# Patient Record
Sex: Male | Born: 1970 | Race: White | Hispanic: No | Marital: Married | Smoking: Former smoker
Health system: Southern US, Community
[De-identification: ages and names within clinical notes are randomized; demographics above are authoritative.]

## PROBLEM LIST (undated history)

## (undated) DIAGNOSIS — B019 Varicella without complication: Secondary | ICD-10-CM

## (undated) DIAGNOSIS — R519 Headache, unspecified: Secondary | ICD-10-CM

## (undated) DIAGNOSIS — L409 Psoriasis, unspecified: Secondary | ICD-10-CM

## (undated) DIAGNOSIS — R51 Headache: Secondary | ICD-10-CM

## (undated) HISTORY — DX: Varicella without complication: B01.9

## (undated) HISTORY — PX: FOOT SURGERY: SHX648

## (undated) HISTORY — PX: APPENDECTOMY: SHX54

## (undated) HISTORY — PX: BACK SURGERY: SHX140

## (undated) HISTORY — DX: Headache: R51

## (undated) HISTORY — DX: Headache, unspecified: R51.9

---

## 2010-12-21 ENCOUNTER — Emergency Department (HOSPITAL_COMMUNITY)
Admission: EM | Admit: 2010-12-21 | Discharge: 2010-12-21 | Payer: Self-pay | Source: Home / Self Care | Admitting: Emergency Medicine

## 2011-01-12 ENCOUNTER — Emergency Department (HOSPITAL_COMMUNITY)
Admission: EM | Admit: 2011-01-12 | Discharge: 2011-01-12 | Payer: Self-pay | Source: Home / Self Care | Admitting: Emergency Medicine

## 2011-01-13 ENCOUNTER — Ambulatory Visit (HOSPITAL_COMMUNITY)
Admission: RE | Admit: 2011-01-13 | Discharge: 2011-01-13 | Payer: Self-pay | Source: Home / Self Care | Attending: Emergency Medicine | Admitting: Emergency Medicine

## 2011-05-15 ENCOUNTER — Emergency Department (HOSPITAL_COMMUNITY): Payer: Self-pay

## 2011-05-15 ENCOUNTER — Emergency Department (HOSPITAL_COMMUNITY)
Admission: EM | Admit: 2011-05-15 | Discharge: 2011-05-15 | Disposition: A | Discharge status: Home / Self Care | Payer: Self-pay | Attending: Emergency Medicine | Admitting: Emergency Medicine

## 2011-05-15 DIAGNOSIS — M25539 Pain in unspecified wrist: Secondary | ICD-10-CM | POA: Insufficient documentation

## 2011-05-15 DIAGNOSIS — Y998 Other external cause status: Secondary | ICD-10-CM | POA: Insufficient documentation

## 2011-05-15 DIAGNOSIS — S20229A Contusion of unspecified back wall of thorax, initial encounter: Secondary | ICD-10-CM | POA: Insufficient documentation

## 2011-05-15 DIAGNOSIS — W19XXXA Unspecified fall, initial encounter: Secondary | ICD-10-CM | POA: Insufficient documentation

## 2011-05-15 DIAGNOSIS — Y92009 Unspecified place in unspecified non-institutional (private) residence as the place of occurrence of the external cause: Secondary | ICD-10-CM | POA: Insufficient documentation

## 2011-05-15 DIAGNOSIS — Z79899 Other long term (current) drug therapy: Secondary | ICD-10-CM | POA: Insufficient documentation

## 2011-05-15 DIAGNOSIS — IMO0002 Reserved for concepts with insufficient information to code with codable children: Secondary | ICD-10-CM | POA: Insufficient documentation

## 2011-05-18 ENCOUNTER — Other Ambulatory Visit (HOSPITAL_COMMUNITY): Payer: Self-pay | Admitting: Orthopedic Surgery

## 2011-05-18 DIAGNOSIS — M545 Low back pain: Secondary | ICD-10-CM

## 2011-05-25 ENCOUNTER — Other Ambulatory Visit (HOSPITAL_COMMUNITY): Payer: Self-pay | Admitting: Orthopedic Surgery

## 2011-05-25 DIAGNOSIS — M545 Low back pain: Secondary | ICD-10-CM

## 2011-05-27 ENCOUNTER — Ambulatory Visit (HOSPITAL_COMMUNITY)
Admission: RE | Admit: 2011-05-27 | Discharge: 2011-05-27 | Disposition: A | Discharge status: Home / Self Care | Payer: BC Managed Care – PPO | Source: Ambulatory Visit | Attending: Orthopedic Surgery | Admitting: Orthopedic Surgery

## 2011-05-27 ENCOUNTER — Emergency Department (HOSPITAL_COMMUNITY)
Admission: EM | Admit: 2011-05-27 | Discharge: 2011-05-27 | Disposition: A | Discharge status: Home / Self Care | Payer: BC Managed Care – PPO | Attending: Emergency Medicine | Admitting: Emergency Medicine

## 2011-05-27 DIAGNOSIS — D1809 Hemangioma of other sites: Secondary | ICD-10-CM | POA: Insufficient documentation

## 2011-05-27 DIAGNOSIS — M545 Low back pain, unspecified: Secondary | ICD-10-CM | POA: Insufficient documentation

## 2011-05-27 DIAGNOSIS — M48061 Spinal stenosis, lumbar region without neurogenic claudication: Secondary | ICD-10-CM | POA: Insufficient documentation

## 2011-05-27 DIAGNOSIS — M5126 Other intervertebral disc displacement, lumbar region: Secondary | ICD-10-CM | POA: Insufficient documentation

## 2011-05-27 DIAGNOSIS — M79609 Pain in unspecified limb: Secondary | ICD-10-CM | POA: Insufficient documentation

## 2011-06-02 ENCOUNTER — Other Ambulatory Visit (HOSPITAL_COMMUNITY): Payer: Self-pay | Admitting: Orthopedic Surgery

## 2011-06-02 ENCOUNTER — Encounter (HOSPITAL_COMMUNITY)
Admission: RE | Admit: 2011-06-02 | Discharge: 2011-06-02 | Disposition: A | Discharge status: Home / Self Care | Payer: BC Managed Care – PPO | Source: Ambulatory Visit | Attending: Orthopedic Surgery | Admitting: Orthopedic Surgery

## 2011-06-02 ENCOUNTER — Ambulatory Visit (HOSPITAL_COMMUNITY)
Admission: RE | Admit: 2011-06-02 | Discharge: 2011-06-02 | Disposition: A | Discharge status: Home / Self Care | Payer: BC Managed Care – PPO | Source: Ambulatory Visit | Attending: Orthopedic Surgery | Admitting: Orthopedic Surgery

## 2011-06-02 DIAGNOSIS — M5126 Other intervertebral disc displacement, lumbar region: Secondary | ICD-10-CM

## 2011-06-02 LAB — COMPREHENSIVE METABOLIC PANEL
BUN: 12 mg/dL (ref 6–23)
CO2: 25 mEq/L (ref 19–32)
Chloride: 103 mEq/L (ref 96–112)
Creatinine, Ser: 1.11 mg/dL (ref 0.4–1.5)
GFR calc Af Amer: >60 mL/min (ref >60)
GFR calc non Af Amer: >60 mL/min (ref >60)
Total Bilirubin: 0.3 mg/dL (ref 0.3–1.2)

## 2011-06-02 LAB — TYPE AND SCREEN: Antibody Screen: NEGATIVE

## 2011-06-02 LAB — CBC
Hemoglobin: 17.4 g/dL — ABNORMAL HIGH (ref 13.0–17.0)
MCHC: 36.3 g/dL — ABNORMAL HIGH (ref 30.0–36.0)

## 2011-06-02 LAB — SURGICAL PCR SCREEN
MRSA, PCR: NEGATIVE
Staphylococcus aureus: POSITIVE — AB

## 2011-06-02 LAB — URINALYSIS, ROUTINE W REFLEX MICROSCOPIC
Bilirubin Urine: NEGATIVE
Leukocytes, UA: NEGATIVE
Nitrite: NEGATIVE
Specific Gravity, Urine: 1.019 (ref 1.005–1.030)
Urobilinogen, UA: 1 mg/dL (ref 0.0–1.0)

## 2011-06-02 LAB — DIFFERENTIAL
Basophils Absolute: 0 10*3/uL (ref 0.0–0.1)
Basophils Relative: 0 % (ref 0–1)
Monocytes Absolute: 0.7 10*3/uL (ref 0.1–1.0)
Neutro Abs: 13.4 10*3/uL — ABNORMAL HIGH (ref 1.7–7.7)
Neutrophils Relative %: 77 % (ref 43–77)

## 2011-06-02 LAB — PROTIME-INR
INR: 0.93 (ref 0.00–1.49)
Prothrombin Time: 12.7 seconds (ref 11.6–15.2)

## 2011-06-03 ENCOUNTER — Inpatient Hospital Stay (HOSPITAL_COMMUNITY)
Admission: RE | Admit: 2011-06-03 | Discharge: 2011-06-05 | DRG: 755 | Disposition: A | Discharge status: Home / Self Care | Payer: BC Managed Care – PPO | Source: Ambulatory Visit | Attending: Orthopedic Surgery | Admitting: Orthopedic Surgery

## 2011-06-03 ENCOUNTER — Inpatient Hospital Stay (HOSPITAL_COMMUNITY): Payer: BC Managed Care – PPO

## 2011-06-03 DIAGNOSIS — Z01812 Encounter for preprocedural laboratory examination: Secondary | ICD-10-CM

## 2011-06-03 DIAGNOSIS — G4700 Insomnia, unspecified: Secondary | ICD-10-CM | POA: Diagnosis present

## 2011-06-03 DIAGNOSIS — F17200 Nicotine dependence, unspecified, uncomplicated: Secondary | ICD-10-CM | POA: Diagnosis present

## 2011-06-03 DIAGNOSIS — M5126 Other intervertebral disc displacement, lumbar region: Principal | ICD-10-CM | POA: Diagnosis present

## 2011-06-03 DIAGNOSIS — Y838 Other surgical procedures as the cause of abnormal reaction of the patient, or of later complication, without mention of misadventure at the time of the procedure: Secondary | ICD-10-CM | POA: Diagnosis not present

## 2011-06-03 DIAGNOSIS — K219 Gastro-esophageal reflux disease without esophagitis: Secondary | ICD-10-CM | POA: Diagnosis present

## 2011-06-03 DIAGNOSIS — K929 Disease of digestive system, unspecified: Secondary | ICD-10-CM | POA: Diagnosis not present

## 2011-06-03 DIAGNOSIS — K560 Paralytic ileus: Secondary | ICD-10-CM | POA: Diagnosis not present

## 2011-06-03 DIAGNOSIS — Y9210 Unspecified residential institution as the place of occurrence of the external cause: Secondary | ICD-10-CM | POA: Diagnosis not present

## 2011-06-04 ENCOUNTER — Inpatient Hospital Stay (HOSPITAL_COMMUNITY): Payer: BC Managed Care – PPO

## 2011-06-04 LAB — CBC
HCT: 39.1 % (ref 39.0–52.0)
Hemoglobin: 14.2 g/dL (ref 13.0–17.0)
MCV: 86.1 fL (ref 78.0–100.0)
RBC: 4.54 MIL/uL (ref 4.22–5.81)
WBC: 19.5 10*3/uL — ABNORMAL HIGH (ref 4.0–10.5)

## 2011-06-04 NOTE — Op Note (Signed)
NAME:  Zachary [name], GOLDMAN NO.:  [id number]  MEDICAL RECORD NO.:  [id number]  LOCATION:  5017                         FACILITY:  [facility]  PHYSICIAN:  Estill Bamberg, MD      DATE OF BIRTH:  1971-08-24  DATE OF PROCEDURE:  06/03/2011 DATE OF DISCHARGE:                              OPERATIVE REPORT   PREOPERATIVE DIAGNOSES: 1. Recurrent left-sided L5-S1 disk herniation. 2. Severe left-sided S1 radiculopathy.  POSTOPERATIVE DIAGNOSES: 1. Recurrent left-sided L5-S1 disk herniation. 2. Severe left-sided S1 radiculopathy.  PROCEDURES: 1. Transforaminal lumbar interbody fusion from the left side, L5-S1. 2. Posterolateral fusion, L5-S1 3. Insertion of posterior mentation, L5-S1. 4. Insertion of intervertebral device x1. 5. Use of local autograft. 6. Intraoperative bone marrow aspiration from a second incision. 7. Intraoperative use of fluoroscopy. 8. Revision laminectomy, L5-S1.  SURGEON:  Estill Bamberg, MD  ASSISTANT:  Janace Litten, OPA  ANESTHESIA:  General endotracheal anesthesia.  COMPLICATIONS:  None.  DISPOSITION:  Stable.  ESTIMATED BLOOD LOSS:  600 mL with 125 mL of Cell Saver retransfused.  INDICATIONS FOR PROCEDURE:  Briefly, Zachary [name] is a pleasant 40 year old male who is status post an L5-S1 microdiskectomy by me.  The patient initially did extremely well for approximately 2-1/2 months.  However, he had a severe recurrence of his left leg pain.  I did order an MRI and did review it, the MRI was clearly consistent with a rather large recurrent left-sided L5-S1 disk herniation.  The patient and myself did have a discussion regarding going forward with repeat diskectomy with a repeat revision diskectomy versus going forward with a diskectomy and a left-sided transforaminal lumbar interbody fusion.  The patient fully understood the risks and limitations of the procedure as outlined in my preoperative note.  OPERATIVE DETAILS:  On June 03, 2011,  the patient was brought to Surgery and general endotracheal anesthesia was administered.  The patient was placed prone on a well-padded Jackson spinal frame.  All bony prominences were meticulously padded.  I did obtain a lateral intraoperative radiograph to identify the trajectory of the L5 and S1 vertebral bodies and the intervertebral space.  The back was then prepped and draped in the usual sterile fashion.  An Ancef was given. The time-out procedure was performed.  I then made an incision from approximately the spinous process of L4 to approximately spinous process of S1.  The fascia was readily identified and sharply incised.  The fascia in the paraspinal musculature was retracted laterally.  I readily identify the L5 and S1 lamina.  I did bring in lateral and intraoperative fluoroscopy and I did confirm the appropriate level.  I then subperiosteally exposed the lamina of L5 and S1 in addition to the transverse processes of L5 and the sacral ala of S1.  I then cannulated the L5 and S1 pedicles first on the right and then on the left.  A ball- tip probe was used and a 7-mm tap was used at S1 and a 6-mm tap was used at L5.  The holes were sealed with bone wax and the posterolateral gutters were packed using Ray-Tec soaked thrombin.  I then turned my attention towards the intervertebral space  on the left side.  The L5-S1 intervertebral space was readily identified.  There was abundant scar identified in the intervertebral space, consistent with the patient's previous microdiskectomy.  I did use a #1 curette to tease away the epidural scar from the bone around it.  I was able to develop the safe plane and I did remove the inferior aspect of L5 including the inferior articular process.  The superior articular process of S1 was also removed.  Of note, this particular portion of the procedure took a meticulous amount of care, given the extensive scar identified.  I then went and turned my  attention towards the decompression aspect of the procedure.  The traversing S1 nerve was readily identified.  There was excessive pressure noted on the nerve and I was unable to easily retract the nerve medially.  I did use a series of curettes in addition to nerve hooks and I was able to develop a plane between the underlying disk fragment and the traversing S1 nerve.  I then was able to safely retract the nerve medially and a very large L5-S1 disk herniation did readily declare itself and this was removed in its entirety.  Again, he had herniation was noted to be very large and causing excessive pressure on the traversing S1 nerve.  However, it was removed in its entirety.  I then identified the exiting L5 nerve and this was gently swept superiorly.  I then turned my attention towards the L5-S1 intervertebral space.  I went forward with a thorough diskectomy using a series of the paddle scrapers in addition to curettes and pituitaries.  After a thorough diskectomy was performed, I went forward with placing trial intervertebral spacers.  I did feel that a 10-mm intervertebral spacer would be the most appropriate fit.  I did use AP and lateral fluoroscopy in order to ensure appropriate positioning of the intervertebral trial and adequate assessment of the length.  At this point, I made a separate incision over the patient's right posterior iliac crest.  I then obtained 8 mL of bone marrow aspirate from the patient's right iliac crest and this was mixed with a 10 mL aliquot of Vitoss BA.  I then compared the intervertebral space with autograft that was obtained from the decompression aspect of the procedure.  Vitoss BA was also utilized. I then filled a 10-mm parallel Concorde bullet cage with autograft mixed with Vitoss BA and the bone marrow aspirate mixture.  This was tamped into position under live fluoroscopy.  I did know an excellent press-fit in an excellent parents on AP and  lateral fluoroscopic views.  I then placed the left-sided L5 and S1 instrumentation.  The 7 x 45 mm screws were placed at L5 and 8 x 45-mm screws were placed at S1.  I did use triggered EMG to test each of the screws.  On the left side, there was no motor evoked potential that occurred at anything less than 30 mA.  I then placed a 35-mm rod and a compression maneuver was performed across the rod.  Final locking procedure was then done.  I then turned my attention towards the patient's right side.  At this point, I copiously irrigated the wound with approximately 1 liter of normal saline.  I then used a high-speed bur to decorticate the transverse process of L5, the sacral ala of S1 in addition to the posterior elements of L5 and S1. Autograft and Vitoss BA was packed into the posterolateral gutter and screws were  placed again using the size as previously mentioned.  I then tested the screws.  The right side screw tested 23 and the S1 screw tested 30.  A 30-mm rod was placed and the final locking procedure was performed.  I was very happy with the final construct.  I again obtained lateral and an AP fluoroscopy in order to confirm an appropriate appearance of the construct.  I was very happy with the final appearance.  All epidural bleeding was controlled using bipolar electrocautery.  A deep Blake drain was placed across the posterior elements on the left side.  The fascia was then closed using #1 Vicryl. The subcutaneous layer was closed using 0 Vicryl in addition to 2-0 Vicryl and the skin was closed using 3-0 Monocryl, as was the iliac crest harvesting site.  A sterile dressing was applied and the patient was rolled supine and was awakened from general endotracheal anesthesia uneventfully.  Of note, Janace Litten was my assistant throughout the procedure and aided in essential retraction and suctioning required throughout the surgery.     Estill Bamberg, MD     MD/MEDQ  D:   06/03/2011  T:  06/04/2011  Job:  [id number]  Electronically Signed by Loraine Leriche Ethridge Sollenberger  on 06/04/2011 05:38:08 PM

## 2011-06-16 ENCOUNTER — Inpatient Hospital Stay (HOSPITAL_COMMUNITY)
Admission: EM | Admit: 2011-06-16 | Discharge: 2011-06-19 | DRG: 297 | Disposition: A | Discharge status: Home / Self Care | Payer: BC Managed Care – PPO | Source: Ambulatory Visit | Attending: Internal Medicine | Admitting: Internal Medicine

## 2011-06-16 ENCOUNTER — Emergency Department (HOSPITAL_COMMUNITY): Payer: BC Managed Care – PPO

## 2011-06-16 DIAGNOSIS — R197 Diarrhea, unspecified: Secondary | ICD-10-CM | POA: Diagnosis present

## 2011-06-16 DIAGNOSIS — D72829 Elevated white blood cell count, unspecified: Secondary | ICD-10-CM | POA: Diagnosis present

## 2011-06-16 DIAGNOSIS — D649 Anemia, unspecified: Secondary | ICD-10-CM | POA: Diagnosis present

## 2011-06-16 DIAGNOSIS — E860 Dehydration: Principal | ICD-10-CM | POA: Diagnosis present

## 2011-06-16 DIAGNOSIS — E876 Hypokalemia: Secondary | ICD-10-CM | POA: Diagnosis present

## 2011-06-16 DIAGNOSIS — M549 Dorsalgia, unspecified: Secondary | ICD-10-CM | POA: Diagnosis present

## 2011-06-16 DIAGNOSIS — R112 Nausea with vomiting, unspecified: Secondary | ICD-10-CM | POA: Diagnosis present

## 2011-06-16 DIAGNOSIS — F17200 Nicotine dependence, unspecified, uncomplicated: Secondary | ICD-10-CM | POA: Diagnosis present

## 2011-06-16 LAB — MAGNESIUM: Magnesium: 1.9 mg/dL (ref 1.5–2.5)

## 2011-06-16 LAB — CBC
HCT: 41.7 % (ref 39.0–52.0)
MCHC: 36.7 g/dL — ABNORMAL HIGH (ref 30.0–36.0)
MCV: 83.2 fL (ref 78.0–100.0)
RDW: 11.7 % (ref 11.5–15.5)

## 2011-06-16 LAB — CLOSTRIDIUM DIFFICILE BY PCR: Toxigenic C. Difficile by PCR: NEGATIVE

## 2011-06-16 LAB — DIFFERENTIAL
Eosinophils Relative: 1 % (ref 0–5)
Lymphocytes Relative: 16 % (ref 12–46)
Lymphs Abs: 3.2 10*3/uL (ref 0.7–4.0)
Monocytes Absolute: 1.3 10*3/uL — ABNORMAL HIGH (ref 0.1–1.0)
Monocytes Relative: 7 % (ref 3–12)

## 2011-06-16 LAB — COMPREHENSIVE METABOLIC PANEL
Albumin: 3.9 g/dL (ref 3.5–5.2)
BUN: 14 mg/dL (ref 6–23)
Creatinine, Ser: 1.09 mg/dL (ref 0.50–1.35)
GFR calc Af Amer: >60 mL/min (ref >60)
Glucose, Bld: 106 mg/dL — ABNORMAL HIGH (ref 70–99)
Total Bilirubin: 0.5 mg/dL (ref 0.3–1.2)
Total Protein: 7.5 g/dL (ref 6.0–8.3)

## 2011-06-17 LAB — CBC
Hemoglobin: 12.7 g/dL — ABNORMAL LOW (ref 13.0–17.0)
MCH: 29.5 pg (ref 26.0–34.0)
MCHC: 35.2 g/dL (ref 30.0–36.0)
Platelets: 328 10*3/uL (ref 150–400)
RDW: 11.7 % (ref 11.5–15.5)

## 2011-06-17 LAB — BASIC METABOLIC PANEL
BUN: 11 mg/dL (ref 6–23)
Calcium: 8.9 mg/dL (ref 8.4–10.5)
GFR calc Af Amer: >60 mL/min (ref >60)
GFR calc non Af Amer: >60 mL/min (ref >60)
Potassium: 3.8 mEq/L (ref 3.5–5.1)

## 2011-06-17 NOTE — Consult Note (Signed)
NAME:  Zachary [name], Zachary [name] NO.:  [id number]  MEDICAL RECORD NO.:  [id number]  LOCATION:  5017                         FACILITY:  [facility]  PHYSICIAN:  Ardeth Sportsman, MD     DATE OF BIRTH:  1971-12-07  DATE OF CONSULTATION: DATE OF DISCHARGE:                                CONSULTATION   REQUESTING PHYSICIAN:  Estill Bamberg, MD  SURGEON:  Ardeth Sportsman, MD  REASON FOR EVALUATION:  Abdominal pain distention, question ileus versus perforation status post postop day 1 from spinal surgery.  HISTORY OF PRESENT ILLNESS:  Zachary [name] is a 40 year old overweight male who is on chronic antiinflammatories and muscle relaxants for chronic low back pain and lumbar disk disease.  He underwent interbody fusion, L5-S1, and no other interventions at this level for recurrent left-sided disk herniation with significant radiculopathy especially this one level.  Postoperatively, he has been on a PCA and has gone through about 4 syringes and also OR medicines.  He tolerated about half of his breakfast, half of his lunch, but just before lunch, began to have some abdominal discomfort and has became more intense.  He became more distended. Based on concerns, Dr. Yevette Edwards requested surgical evaluation.  The patient has a history of heartburn and reflux, he normally has a bowel movement every morning.  He has not had a bowel movements since surgery.  He denies any fever, chills, sweats.  He has some moderate back pain when he coughs, but not really in the abdomen.  I examined him some burping and eructation.  No hematochezia, melena.  No history of inflammatory bowel disease or irritable bowel syndrome.  No family history of any other GI disorders.  He does not take nonsteroidals at home.  PAST MEDICAL HISTORY: 1. Tobacco abuse. 2. Lumbar disk disease, status post surgery in April 2012 and also     yesterday. 3. Insomnia. 4. Chronic pain.  PAST SURGICAL HISTORY:  Appendectomy in  2002, lumbar back surgery in April 2012, and again L5-S1 back surgery yesterday as noted above.  SOCIAL HISTORY:  He has got about a 50-pack-year history of tobacco and currently smokes about 1-1/2 packs a day.  Occasionally, drinks alcohol and no other drug use.  He is married, in a stable relationship with 2 children, his family is here at bedside.  FAMILY HISTORY:  Negative for any GI disorders or any other abnormalities, they can recall.  REVIEW OF SYSTEMS:  As noted per HPI.  GENERAL:  No fevers, chills, or sweats.  No weight change.  Eyes:  No vision changes.  No scotomas. Lungs, Cardiac, Respiratory:  Otherwise, negative.  GI:  As noted above. No heartburn or reflux right now.  No hematochezia or melena.  He has been constipated.  He has not had any flatus since surgery.  HEENT:  No ear  changes or drainage.  Breasts:  No nipple pain or discharge. Genital:  No difficulty with urination or hematuria or polyuria. Hepatic, renal, and endocrine is negative.  Musculoskeletal:  Low back pain as noted above.  Otherwise, no new joint pain or aches. Heme/lymphatic:  Otherwise negative.  Psych:  No mood changes or other  changes.  PHYSICAL EXAMINATION:  VITAL SIGNS:  Temperature 98.1, pulse has been in the 60s, respirations 18, he is 96% on room air.  Blood pressure 110/74. GENERAL:  He is a well-developed, well-nourished white male, sitting up in a chair.  He was calmly talking on his phone with Dr. Yevette Edwards when I walked into room. PSYCH:  He is pleasant and interactive, good insight.  No evidence of any dementia, delirium, psychosis, or paranoia. EYES:  Pupils equal, round, and reactive to light.  Extraocular movements are intact. NECK:  Supple.  No masses.  Trachea is midline. LYMPH:  No headache, axillary or groin lymphadenopathy. HEART:  Regular rate and rhythm.  No murmurs, gallops, or rubs. BREAST:  No nipple masses or discharge. CHEST:  Clear to auscultation bilaterally.  No  wheezes, rales, or rhonchi. ABDOMEN:  Obese, but soft.  He has periumbilical incision, but no hernia. GENITAL:  Normal external male genitalia, uncircumcised.  No inguinal hernias.  Testes and scrotum otherwise normal. BACK:  He has a pain in his lower back area, I did not change dressing at this time.  Of note, his abdomen is moderately distended, but mostly soft.  Cough and percussion  did not exacerbate his pain, arguing against peritonitis. NEUROLOGICAL:  I did not check a gait, but cranial nerves II through XII are intact.  Hand grip is 5/5 equal and symmetrical.  There is no focal deficits.  No resting or intention tremors. SKIN:  No petechiae.  No purpura.  No other sores or lesions. MUSCULOSKELETAL:  Pretty range of motion in shoulders, elbows, wrists, as well as hips, knees and ankles.  He is limited in his mobility and range of motion of the lumbar spine was reasonable.  STUDIES:  Not available.  ASSESSMENT AND PLAN:  A 40 year old male postop day 1 from lumbar surgery with increased abdominal distention and pain, discomfort most likely consistent with ileus. 1. Agree with abdominal x-ray.  I will upgrade to a 3-way just to make     sure if there is no evidence of free air or abnormalities. 2. CBC sent. 3. Make n.p.o. with ice chips. 4. Try to minimize narcotics.  Although, certainly 0 narcotics it is     not possible on postop day 1 it seems like he sucked through a fair     amount already. 5. Increased mobility as tolerated.  It is cleared by PT.  He has a     cane.  I discussed with Dr. Yevette Edwards and he feels that mobilization     is reasonable and we will try to get him up more and more active.     Hopefully his ileus will resolve. 6. Suppository. 7. We will follow with you.  If the x-ray shows evidence of free air     or preparation,  or he has worsening clinical status, then we will     consider laparotomy.  However, I find that highly unlikely at an L5-     S1  level where the risk of injuries is very low and the case went     pretty routine and rectal perforation seems highly unlikely unless     there was something going on technically in the case which Dr.     Yevette Edwards did not feel happened.  Nonetheless, we will follow.  He     may need NG tube if he worsens.  Plan was discussed with the     patient and his family and  they agree.     Ardeth Sportsman, MD     SCG/MEDQ  D:  06/04/2011  T:  06/05/2011  Job:  [id number]  cc:   Estill Bamberg, MD  Electronically Signed by Karie Soda MD on 06/17/2011 07:15:49 AM

## 2011-06-18 ENCOUNTER — Other Ambulatory Visit (HOSPITAL_COMMUNITY): Payer: Self-pay

## 2011-06-18 LAB — RETICULOCYTES
RBC.: 4.37 MIL/uL (ref 4.22–5.81)
Retic Ct Pct: 1.3 % (ref 0.4–3.1)

## 2011-06-18 LAB — CBC
MCV: 84.9 fL (ref 78.0–100.0)
Platelets: 272 10*3/uL (ref 150–400)
RBC: 3.91 MIL/uL — ABNORMAL LOW (ref 4.22–5.81)
RDW: 11.8 % (ref 11.5–15.5)
WBC: 8.3 10*3/uL (ref 4.0–10.5)

## 2011-06-18 LAB — COMPREHENSIVE METABOLIC PANEL
ALT: 39 U/L (ref 0–53)
AST: 27 U/L (ref 0–37)
Albumin: 2.9 g/dL — ABNORMAL LOW (ref 3.5–5.2)
Alkaline Phosphatase: 54 U/L (ref 39–117)
CO2: 26 mEq/L (ref 19–32)
Chloride: 107 mEq/L (ref 96–112)
GFR calc non Af Amer: >60 mL/min (ref >60)
Potassium: 3.7 mEq/L (ref 3.5–5.1)
Sodium: 139 mEq/L (ref 135–145)
Total Bilirubin: 0.3 mg/dL (ref 0.3–1.2)

## 2011-06-18 LAB — FERRITIN: Ferritin: 170 ng/mL (ref 22–322)

## 2011-06-18 LAB — IRON AND TIBC
Saturation Ratios: 29 % (ref 20–55)
TIBC: 273 ug/dL (ref 215–435)
UIBC: 194 ug/dL

## 2011-06-18 LAB — FOLATE: Folate: 10.8 ng/mL

## 2011-06-18 LAB — TISSUE TRANSGLUTAMINASE, IGG: Tissue Transglut Ab: 14.7 U/mL (ref <20)

## 2011-06-19 LAB — BASIC METABOLIC PANEL
CO2: 27 mEq/L (ref 19–32)
Calcium: 8.7 mg/dL (ref 8.4–10.5)
Creatinine, Ser: 0.9 mg/dL (ref 0.50–1.35)
GFR calc non Af Amer: >60 mL/min (ref >60)
Glucose, Bld: 122 mg/dL — ABNORMAL HIGH (ref 70–99)
Sodium: 137 mEq/L (ref 135–145)

## 2011-06-19 LAB — CBC
Hemoglobin: 12.3 g/dL — ABNORMAL LOW (ref 13.0–17.0)
MCH: 30.2 pg (ref 26.0–34.0)
MCHC: 36 g/dL (ref 30.0–36.0)
MCV: 84 fL (ref 78.0–100.0)
RBC: 4.07 MIL/uL — ABNORMAL LOW (ref 4.22–5.81)

## 2011-06-20 LAB — STOOL CULTURE

## 2011-06-20 LAB — EHEC TOXIN BY EIA, STOOL

## 2011-06-21 ENCOUNTER — Telehealth: Payer: Self-pay

## 2011-06-21 NOTE — Telephone Encounter (Signed)
I attempted to reach the patient to schedule an office visit per Dr Leone Payor.  He was asked to follow him for abnormal celiac labs drawn while he was inpatient.  The phone is a non working number.  I have mailed him a letter asking him to call and schedule an office visit with Dr Leone Payor

## 2011-06-27 ENCOUNTER — Encounter: Payer: Self-pay | Admitting: *Deleted

## 2011-06-27 DIAGNOSIS — Z87891 Personal history of nicotine dependence: Secondary | ICD-10-CM | POA: Insufficient documentation

## 2011-06-27 DIAGNOSIS — R609 Edema, unspecified: Secondary | ICD-10-CM | POA: Insufficient documentation

## 2011-06-27 DIAGNOSIS — M79609 Pain in unspecified limb: Secondary | ICD-10-CM | POA: Insufficient documentation

## 2011-06-27 NOTE — ED Notes (Signed)
PATIENT WITH BILAT. FEET SWELLING, BACK SURGERY ON 06/03/11- DISC FUSION

## 2011-06-28 ENCOUNTER — Emergency Department (HOSPITAL_COMMUNITY)
Admission: EM | Admit: 2011-06-28 | Discharge: 2011-06-28 | Disposition: A | Discharge status: Home / Self Care | Payer: BC Managed Care – PPO | Attending: Emergency Medicine | Admitting: Emergency Medicine

## 2011-06-28 DIAGNOSIS — R609 Edema, unspecified: Secondary | ICD-10-CM

## 2011-06-28 NOTE — ED Notes (Signed)
Pt reports mild edema in feet.  History of back surgery 3 weeks ago.  Negative for calf tenderness.  Pulses palpable, cap refill <3 sec.  EDP at bedside.

## 2011-06-30 ENCOUNTER — Ambulatory Visit (HOSPITAL_COMMUNITY)
Admission: RE | Admit: 2011-06-30 | Discharge: 2011-06-30 | Disposition: A | Discharge status: Home / Self Care | Payer: BC Managed Care – PPO | Source: Ambulatory Visit | Attending: Family Medicine | Admitting: Family Medicine

## 2011-06-30 DIAGNOSIS — M7989 Other specified soft tissue disorders: Secondary | ICD-10-CM

## 2011-06-30 DIAGNOSIS — M79609 Pain in unspecified limb: Secondary | ICD-10-CM | POA: Insufficient documentation

## 2011-07-01 NOTE — ED Provider Notes (Addendum)
History     Chief Complaint  Patient presents with  . Leg Swelling   HPI Comments: C/o BLE swelling for a few days; no calf tenderness, f/c. S/p recent back sx L5-S1 2-3 weeks ago; ambulatory without neuro defecits  Patient is a 40 y.o. male presenting with leg pain. The history is provided by the patient.  Leg Pain  The incident occurred 2 days ago. There was no injury mechanism. The pain is present in the left foot and right foot. The patient is experiencing no pain. Pertinent negatives include no numbness, no loss of motion, no muscle weakness, no loss of sensation and no tingling. Associated symptoms comments: No pain, only BLE feet and ankle swelling.    History reviewed. No pertinent past medical history.  Past Surgical History  Procedure Date  . Back surgery   . Appendectomy     History reviewed. No pertinent family history.  History  Substance Use Topics  . Smoking status: Former Smoker    Quit date: 06/03/2011  . Smokeless tobacco: Not on file  . Alcohol Use: No      Review of Systems  Neurological: Negative for tingling and numbness.  All other systems reviewed and are negative.    Physical Exam  BP 118/71  Pulse 90  Temp(Src) 98.2 F (36.8 C) (Oral)  Resp 18  Ht 6' (1.829 m)  Wt 240 lb (108.863 kg)  BMI 32.55 kg/m2  SpO2 98%  Physical Exam  Nursing note and vitals reviewed. Constitutional: He is oriented to person, place, and time. No distress.       Appearance consistent with age of record  HENT:  Head: Normocephalic and atraumatic.  Right Ear: External ear normal.  Left Ear: External ear normal.  Nose: Nose normal.  Mouth/Throat: Oropharynx is clear and moist.  Eyes: Conjunctivae are normal.  Neck: Neck supple.  Cardiovascular: Normal rate and regular rhythm.  Exam reveals no gallop and no friction rub.   No murmur heard. Pulmonary/Chest: Effort normal and breath sounds normal. He has no wheezes. He has no rhonchi. He has no rales. He  exhibits no tenderness.  Abdominal: Soft. There is no tenderness.  Musculoskeletal: Normal range of motion. He exhibits edema.       1+ edema from ankle to feet; no calf or post thigh ttp; no HOMAN's sign; no clinical evidence of DVT  Neurological: He is alert and oriented to person, place, and time. No sensory deficit.  Skin: No rash noted.       Color normal  Psychiatric: He has a normal mood and affect.    ED Course  Procedures  Written by Enos Fling acting as scribe for Dr. Adriana Simas.  MDM No clinical evidence DVT; edema minimal; no testing necessary; pt and wife comfortable with discussion.    I personally performed the services described in this documentation, which was scribed in my presence. The recorded information has been reviewed and considered.  Donnetta Hutching, MD 07/01/11 1440

## 2011-07-03 NOTE — Discharge Summary (Signed)
  NAME:  Zachary, [name] NO.:  [id number]  MEDICAL RECORD NO.:  [id number]  LOCATION:  5017                         FACILITY:  [facility]  PHYSICIAN:  Estill Bamberg, MD      DATE OF BIRTH:  [birth year]  DATE OF ADMISSION:  06/03/2011 DATE OF DISCHARGE:  06/05/2011                              DISCHARGE SUMMARY   ADMITTING PHYSICIAN:  Estill Bamberg, MD  ADMISSION DIAGNOSES:  Recurrent left-sided L5-S1 disk herniation and severe left-sided S1 radiculopathy.  DISCHARGE DIAGNOSES:  Recurrent left-sided L5-S1 disk herniation and severe left-sided S1 radiculopathy.  PROCEDURE:  Left-sided L5-S1 transforaminal lumbar interbody fusion with revision decompression performed on June 03, 2011.  ADMISSION HISTORY:  Briefly, [name] is a pleasant 40 year old male who is status post a L5-S1 microdiskectomy by me.  The patient did extremely well for approximately two and half months.  However, did have recurrent disk herniation on the left side.  We did go forward with conservative care, but the patient continue to have severe and debilitating pain.  We therefore had a discussion regarding a revision microdiskectomy versus one level fusion procedure in the form of a transforaminal lumbar interbody fusion.  We did elect to go forward with the latter.  The patient was therefore admitted on June 03, 2011, for this procedure.  HOSPITAL COURSE:  The patient was admitted, date noted above and further procedure noted above.  The patient tolerated the procedure well and was transferred to recovery in stable condition.  Of note, the patient noted some abdominal discomfort.  In the afternoon of postop day #1, the patient had some distention and I did contact Dr. Karie Soda from General Surgery to evaluate the patient.  Radiographs were essentially consistent with an ileus.  This was simply observed and the patient's symptoms did resolve overtime.  The patient was mobilized  throughout his hospital stay and it was felt on the morning of postop day #2 that the patient's pain was well controlled and the patient was able to be safely discharged home.  The patient's abdominal discomfort did subside over time.  Of note, the patient's left-sided radiculopathy was entirely resolved immediately following his procedure.  DISCHARGE INSTRUCTIONS:  The patient will adhere to back precautions at all times.  He will keep the wound dry for total of 5 days.  He will follow up with me in approximately 2 weeks after his procedure.  He will take Percocet for pain and Valium for spasms.     Estill Bamberg, MD    MD/MEDQ  D:  06/28/2011  T:  06/28/2011  Job:  [id number]  Electronically Signed by Estill Bamberg  on 07/03/2011 04:15:42 PM

## 2011-07-08 NOTE — Discharge Summary (Signed)
NAME:  Zachary [name], ALLUMS NO.:  [id number]  MEDICAL RECORD NO.:  [id number]  LOCATION:  5528                         FACILITY:  [facility]  PHYSICIAN:  Altha Harm, MDDATE OF BIRTH:  [birth year]  DATE OF ADMISSION:  06/16/2011 DATE OF DISCHARGE:  06/19/2011                              DISCHARGE SUMMARY   DISCHARGE DISPOSITION:  Home.  FINAL DISCHARGE DIAGNOSES: 1. Diarrhea clostridium difficile negative. 2. Dehydration resolved. 3. Hypokalemia resolved. 4. Leukocytosis resolved. 5. Status post recent L5-S1 herniated disk status post disk fusion on     July 04, 2011.  DISCHARGE MEDICATIONS: 1. Flagyl 500 mg p.o. q. 8 hours x7 days. 2. Ambien 5 mg p.o. at bedtime p.r.n. 3. Valium 5 mg p.o. at bedtime. 4. Flexeril 5 mg p.o. b.i.d. p.r.n. 5. Hydrocodone/APAP 10/325 0.5 tablets p.o. q.6 h. p.r.n. pain.  CONSULTANTS:  None.  PROCEDURES:  None.  DIAGNOSTIC STUDIES:  Acute abdominal series which shows a nonspecific bowel gas pattern and no acute abnormalities.  PRIMARY CARE PHYSICIAN:  Unassigned.  CODE STATUS:  Full code.  ALLERGIES:  No known drug allergies.  CHIEF COMPLAINT:  Diarrhea, nausea, vomiting.  HISTORY OF PRESENT ILLNESS:  Please refer to the H and P by Dr. Ardyth Harps for details of the HPI.  However, in short, this is a 40 year old gentleman who had a recent hospitalization approximately 2 weeks ago for herniated disk.  The patient received IV antibiotics on that admission.  He developed a partial small-bowel obstruction which resolved spontaneously.  The patient was discharged home 4 days later. He says that the past 3-4 days he has been having copious watery diarrhea, greenish in color, foul odor about 40 episodes a day.  The patient was referred to [facility] for further evaluation and management.  HOSPITAL COURSE: 1. Diarrhea.  The patient had diarrhea and was presumed likely to be C     diff.  C diff PCR was sent which  was found to be negative.     However, given the clinical symptoms of the patient's history he     was started on Flagyl empirically and he is to complete a 10-day     course of Flagyl.  The patient also had confounding history     including celiac disease in his father and colon cancer in a     grandfather.  In light of this, it is recommended that the patient     should have follow-up evaluation with a gastroenterologist likely a     colonoscopy when he is in convalescent state.  Antibodies for     celiac disease including tTG, IgA and IgG were drawn here and is to     be followed up by Gastroenterology.  I have spoken with Dr. Stan Head from [facility] and he will follow up with the patient as     an outpatient.  The patient has been given the number to the office     and asked to follow up within a week.  The patient also offered that he had been on narcotics and has stopped them abruptly and wondered whether or not this could  have contributed to diarrhea.  It is possible but less likely given a short course which the patient was on narcotics. 1. Hypokalemia.  The patient was hypokalemic likely due to copious     diarrhea.  This was repleted orally and the patient is on normal     saline at the time of discharge. 2. Leukocytosis.  Leukocytosis felt to be associated with diarrhea and     with dehydration.  This is now resolved.  The patient was advancing his diet is now tolerating diet without any difficulty.  His stool at this time is pancake consistency and he has no further diarrhea.  PERTINENT LABORATORY STUDIES:  At the time of discharge, the patient had a white blood cell count of 7.3, hemoglobin of 12.3, hematocrit of 34.2, platelet count of 267.  Sodium 137, potassium 3.8, chloride 101, bicarb 27, BUN 10, creatinine 0.90.  CONDITION:  At the time of discharge is stable.  PHYSICAL EXAMINATION:  VITAL SIGNS:  Temperature is 97.8, blood pressure 122/79, respiratory  rate 20, heart rate 78, O2 sats 96% on room air. HEENT:  He is normocephalic, atraumatic.  Pupils are equally round and reactive to light and accommodation.  Extraocular movements are intact. Oropharynx is moist.  No active erythema or lesions are noted. NECK:  Trachea is midline.  No masses, no thyromegaly, no JVD, no carotid bruit. RESPIRATORY:  The patient has a normal respiratory effort, equal excursion bilaterally.  No wheezing or rhonchi noted. CARDIOVASCULAR:  He has got normal S1-S2.  No murmurs, rubs or gallops are noted.  PMI is nondisplaced, no heaves or thrills on palpation. ABDOMEN:  Obese, soft, nontender, nondistended.  No masses or hepatosplenomegaly. EXTREMITY:  No clubbing, cyanosis or edema. NEUROLOGICAL:  He has got no focal neurological deficits.  Cranial nerves II-XII are grossly intact. PSYCHIATRIC:  He is alert and oriented x3.  Good insight and cognition. Good recent and remote recall.  DIETARY RESTRICTIONS:  None except suggest a gluten-free diet since the patient does have a family history of celiac disease.  PHYSICAL RESTRICTIONS:  None.     Altha Harm, MD     MAM/MEDQ  D:  06/19/2011  T:  06/20/2011  Job:  [id number]  cc:   Iva Boop, MD,FACG  Electronically Signed by Marthann Schiller MD on 07/08/2011 12:21:33 PM

## 2011-07-31 NOTE — H&P (Signed)
NAME:  Zachary, [name] NO.:  [id number]  MEDICAL RECORD NO.:  [id number]  LOCATION:  [facility]                         FACILITY:  [facility]  PHYSICIAN:  Peggye Pitt, M.D. DATE OF BIRTH:  February 09, 1971  DATE OF ADMISSION:  06/16/2011 DATE OF DISCHARGE:                             HISTORY & PHYSICAL   PRIMARY CARE PHYSICIAN:  He is unassigned  CHIEF COMPLAINT:  Diarrhea, nausea and vomiting.  HISTORY OF PRESENT ILLNESS:  [name] is a very pleasant 40 year old Caucasian gentleman, who has a past medical history that is only significant for recent hospitalization in mid June for an L5-S1 herniated disk with severe left S1 radiculopathy.  On that admission, he was on IV antibiotics and it seems like he developed a partial small- bowel obstruction, which resolved spontaneously and was discharged home 4 days later.  He states that about for the past 3-4 days, he has been having copious watery diarrhea that is greenish in color with a very foul odor of about 40 episodes a day.  The abdominal pain is accompanied by cramping and tenesmus.  He states that today he also started having nausea and vomiting of all food contents.  Because of the copious diarrhea, he decided to seek help in the Emergency Department today and we were asked to admit him for further evaluation.  ALLERGIES:  He has no known drug allergies.  PAST MEDICAL HISTORY:  Nothing other than the L5-S1 herniated disk, status post fusion on June 04, 2011.  ,MEDICATIONS:  Significant for: 1. Valium 10 mg at bedtime. 2. As well as Vicodin 10 mg, which he takes 1 tablet every 6 hours as     needed for pain post surgery.  SOCIAL HISTORY:  He has not smoked since his surgery, so he is very proud that he has not smoked a cigarette in 13 days.  He denies any alcohol or illicit drug use.  He is married.  His wife is present at the time of my exam.  FAMILY HISTORY:  Significant for a maternal grandmother with  colon cancer.  Father with celiac disease and hypothyroidism and the mother with hypothyroidism.  REVIEW OF SYSTEMS:  Negative except as mentioned in history of present illness particularly negative for fever.  PHYSICAL EXAMINATION:  VITAL SIGNS:  On admission, blood pressure 134/120 that has dropped to 106/73; heart rate of 113, which has dropped to 96; temperature of 97.7; sats of 100% on room air. GENERAL:  He is alert, awake, oriented x3, is in no current distress. HEENT: Normocephalic, atraumatic.  Pupils are equal, round and reactive to light.  He has intact extraocular movements.  He wears corrective lenses.  He has a dry mucous membranes. NECK:  Supple.  No JVD.  No lymphadenopathy.  No bruits.  No goiter. HEART:  His heart is tachycardic, but with regular rhythm.  No murmurs, rubs or gallops. LUNGS:  Clear to auscultation bilaterally. ABDOMEN:  Currently soft, nontender to palpation, nondistended.  He has high-pitched bowel sounds. EXTREMITIES:  He has no edema.  Positive pedal pulses. NEUROLOGIC EXAMINATION:  Grossly intact and nonfocal.  LABORATORY DATA:  Labs on admission, sodium 136, potassium 3.2, chloride 100,  bicarb 22, BUN 14, creatinine 1.09, glucose of 106.  All of his LFTs are within normal limits.  WBCs 19.8 with an ANC of 15.1, a hemoglobin of 15.3 and a platelet count of 391,000.  DIAGNOSTIC STUDIES:  Acute abdominal series that shows no acute abnormalities with nonspecific bowel gas pattern.  ASSESSMENT AND PLAN: 1. Diarrhea, nausea, vomiting:  Given recent hospitalization and     intravenous antibiotic use, I do believe that he likely has     Clostridium difficile colitis.  We will admit him to the hospital.     We will place him on contact isolation.  We will check a     Clostridium difficile polymerase chain reaction as well as ova,     parasites and stool for bacterial cultures.  I will empirically     start him on Flagyl 500 mg intravenous three  times a day pending     results of Clostridium difficile polymerase chain reaction. 2. Hypokalemia:  This is likely secondary to gastrointestinal losses     and copious amounts of diarrhea.  I will replete his potassium and     give him some magnesium.  His leukocytosis is again likely     secondary to his infectious diarrhea.  I will follow his response     with antibiotics. 3. For deep venous thrombosis prophylaxis he will be on Lovenox.     Peggye Pitt, M.D.     EH/MEDQ  D:  06/16/2011  T:  06/16/2011  Job:  [id number]  Electronically Signed by Peggye Pitt M.D. on 07/31/2011 01:53:35 PM

## 2011-11-29 ENCOUNTER — Ambulatory Visit (HOSPITAL_COMMUNITY)
Admission: RE | Admit: 2011-11-29 | Discharge: 2011-11-29 | Disposition: A | Discharge status: Home / Self Care | Payer: Medicaid Other | Source: Ambulatory Visit | Attending: Orthopedic Surgery | Admitting: Orthopedic Surgery

## 2011-11-29 DIAGNOSIS — IMO0001 Reserved for inherently not codable concepts without codable children: Secondary | ICD-10-CM | POA: Insufficient documentation

## 2011-11-29 DIAGNOSIS — R262 Difficulty in walking, not elsewhere classified: Secondary | ICD-10-CM | POA: Insufficient documentation

## 2011-11-29 DIAGNOSIS — R29898 Other symptoms and signs involving the musculoskeletal system: Secondary | ICD-10-CM | POA: Insufficient documentation

## 2011-11-29 DIAGNOSIS — M545 Low back pain, unspecified: Secondary | ICD-10-CM | POA: Insufficient documentation

## 2011-11-29 DIAGNOSIS — M6281 Muscle weakness (generalized): Secondary | ICD-10-CM | POA: Insufficient documentation

## 2011-11-29 DIAGNOSIS — M2560 Stiffness of unspecified joint, not elsewhere classified: Secondary | ICD-10-CM | POA: Insufficient documentation

## 2011-11-29 NOTE — Patient Instructions (Addendum)
HEP

## 2011-11-29 NOTE — Progress Notes (Signed)
Physical Therapy Evaluation  Patient Details  Name: KALONJI ZURAWSKI MRN: [id number] Date of Birth: [birth year]  Today's Date: 11/29/2011 Time: 4034-7425 Time Calculation (min): 44 min Visit#: 1  of 8   Re-eval: 12/29/11 Assessment Diagnosis: back fusion Surgical Date: 05/24/11 Next MD Visit: 01/21/2012 Prior Therapy: none  Past Medical History: No past medical history on file. Past Surgical History:  Past Surgical History  Procedure Date  . Back surgery   . Appendectomy     Subjective Symptoms/Limitations Symptoms: Mr. Tornow is a 40 yo male with hx of LBP following two back surgeries. The patient states on Jan 24th he went to pick up his daughter and blew a disc out.  In April 5th he had surgery and did well, he went back to work after six weeks and went back to work.  The patient was only able to work for two weeks when he came back out of surgery and  had a fusion in June.  The patient states that he is doing much better than what he was prior to the surgeries but he is still having significant stiffness and some pain.  He is having radicular pain to his left knee.  He has not had any therapy since the surgery.  He is now being referred to therapy to improve his his funtional ability.   Limitations: Sitting;Lifting;Standing;Walking How long can you sit comfortably?: The patient states that his is able to sit for a short time before he starts to fidget.  He can sit for 10 min. How long can you stand comfortably?: The patient states that he gets a lot of tension in his back when he is standing.  He has stood for five minutes.  How long can you walk comfortably?: The patient states that he is able to walk for 20-30.   Repetition: Increases Symptoms Pain Assessment Currently in Pain?: Yes Pain Score:   4 (worst 8/10; lowest 2/10) Pain Location: Back Pain Orientation: Right;Left;Lower Pain Type: Chronic pain Pain Radiating Towards: L LE Leg pain is not constant. Pain Onset: More than  a month ago Pain Frequency: Constant Pain Relieving Factors: Lying downl Multiple Pain Sites: No   Prior Functioning  Home Living Lives With: Spouse Prior Function Vocation: Unemployed Leisure: Hobbies-yes (Comment) Comments: playing golf   Assessment RLE Strength Right Hip Flexion: 5/5 Right Hip Extension: 5/5 Right Hip ABduction: 5/5 Right Hip ADduction: 5/5 Right Knee Flexion: 5/5 Right Knee Extension: 5/5 Right Ankle Dorsiflexion: 5/5 LLE Strength Left Hip Flexion: 3/5 Left Hip Extension: 4/5 Left Hip ABduction: 5/5 Left Hip ADduction: 4/5 Left Knee Flexion: 4/5 Left Knee Extension: 3+/5 Left Ankle Dorsiflexion: 4/5 Lumbar AROM Lumbar Flexion:  (decreased 60%) Lumbar Extension: decreased 80% Lumbar - Right Side Bend:  (decreased 20%) Lumbar - Left Side Bend: decreased 20% Lumbar - Right Rotation: decreased 10% Lumbar - Left Rotation: decreased 10%  Exercise/Treatments Stretches Active Hamstring Stretch: 3 reps;30 seconds Single Knee to Chest Stretch: 3 reps;30 seconds Lower Trunk Rotation: 5 reps Lumbar Exercises  Stability Bent Knee Raise: 10 reps Ab Set: 5 reps Bridge x 10       Physical Therapy Assessment and Plan PT Assessment and Plan Clinical Impression Statement: Pt s/p back fusion with decreased strength, stiffness and pain affecting ADL.  Pt will benefit from skilled PT to address the deficits and progress pt to prior functional level. Rehab Potential: Good Clinical Impairments Affecting Rehab Potential: stiffness, weakness, pain PT Frequency: Min 2X/week PT Duration: 4 weeks PT Treatment/Interventions:  Therapeutic exercise;Functional mobility training;Patient/family education PT Plan: see two times a week for 4 weeks to improve functional abiliy.  Pt has medicaid so will be seen three times this yr and three times next year.    Goals Home Exercise Program Pt will Perform Home Exercise Program: Independently PT Short Term Goals Time to  Complete Short Term Goals: 2 weeks PT Short Term Goal 1: Pt pain to be no greater than a 6 PT Short Term Goal 2: ROM improved by 50% to allow pt to pick items off the floor with ease. PT Short Term Goal 3: Pt to be able to stand for 15-20 minutes without difficulty PT Short Term Goal 4: Pt to be able to sit for 30 min. with comfort. PT Long Term Goals Time to Complete Long Term Goals: 4 weeks PT Long Term Goal 1: I in advance HEP PT Long Term Goal 2: Pain level no greater than a 3 Long Term Goal 3: Able to sit for an hour without difficulty Long Term Goal 4: Pt to be able to stand for 40 min. without difficulty PT Long Term Goal 5: Pt strength wnl to allow the above to occur.  Problem List Patient Active Problem List  Diagnoses  . Left leg weakness  . Stiffness of joints, not elsewhere classified, multiple sites    PT - End of Session Activity Tolerance: Patient tolerated treatment well General Behavior During Session: [facility] for tasks performed Cognition: [facility] for tasks performed   Evamarie Raetz,CINDY 11/29/2011, 4:13 PM  Physician Documentation Your signature is required to indicate approval of the treatment plan as stated above.  Please sign and either send electronically or make a copy of this report for your files and return this physician signed original.   Please mark one 1.__approve of plan  2. ___approve of plan with the following conditions.   ______________________________                                                          _____________________ Physician Signature                                                                                                             Date

## 2011-12-02 ENCOUNTER — Ambulatory Visit (HOSPITAL_COMMUNITY)
Admission: RE | Admit: 2011-12-02 | Discharge: 2011-12-02 | Disposition: A | Discharge status: Home / Self Care | Payer: Medicaid Other | Source: Ambulatory Visit | Attending: Orthopedic Surgery | Admitting: Orthopedic Surgery

## 2011-12-02 NOTE — Progress Notes (Signed)
Physical Therapy Treatment Patient Details  Name: TRESON [name] MRN: [id number] Date of Birth: [birth year]  Today's Date: 12/02/2011 Time: 4098-1191 Time Calculation (min): 44 min Visit#: 2  of 8   Re-eval: 12/29/11 Assessment Diagnosis: back fusion Surgical Date: 05/24/11 Next MD Visit: 01/21/2012 Charges:  therex 42'  Subjective: Symptoms/Limitations Symptoms: Still getting an achey pain with occasional shooting pain down L LE to lateral knee.   Pain Assessment Currently in Pain?: Yes Pain Score:   4 Pain Location: Back Pain Orientation: Lower Pain Radiating Towards: L lateral knee   Exercise/Treatments Stretches Active Hamstring Stretch: 3 reps;30 seconds Single Knee to Chest Stretch: 3 reps;30 seconds Lower Trunk Rotation: 3 reps;10 seconds Stability Clam: 10 reps Bridge: 10 reps Bent Knee Raise: 10 reps Ab Set: 10 reps;5 seconds Straight Leg Raise: 10 reps  Physical Therapy Assessment and Plan PT Assessment and Plan Clinical Impression Statement: Added clams and SLR to POC.  Pt. displays good stability / control with all exercises and no pain reported with any activity. PT Plan: See 2 more times this calendar year and 3 next.  Continue to increase stability.     Problem List Patient Active Problem List  Diagnoses  . Left leg weakness  . Stiffness of joints, not elsewhere classified, multiple sites    PT - End of Session Activity Tolerance: Patient tolerated treatment well General Behavior During Session: Zachary [name] for tasks performed Cognition: Zachary [name] for tasks performed  Zachary [name] 12/02/2011, 11:54 AM

## 2011-12-07 ENCOUNTER — Ambulatory Visit (HOSPITAL_COMMUNITY)
Admission: RE | Admit: 2011-12-07 | Discharge: 2011-12-07 | Disposition: A | Discharge status: Home / Self Care | Payer: Medicaid Other | Source: Ambulatory Visit | Attending: Orthopedic Surgery | Admitting: Orthopedic Surgery

## 2011-12-07 NOTE — Progress Notes (Signed)
Physical Therapy Treatment Patient Details  Name: ASTER ECKRICH MRN: [id number] Date of Birth: 1971/07/17  Today's Date: 12/07/2011 Time: 4098-1191 Time Calculation (min): 48 min Visit#: 3  of 8   Re-eval: 12/29/11 Charges: Therex x 44'  Subjective: Symptoms/Limitations Symptoms: Pt reports back pain at 3/10 after pain medicine. Pain Assessment Currently in Pain?: Yes Pain Score:   3 Pain Location: Back Pain Orientation: Lower   Exercise/Treatments Stretches Active Hamstring Stretch: 3 reps;30 seconds Single Knee to Chest Stretch: 3 reps;30 seconds Lower Trunk Rotation: 3 reps;10 seconds Stability Clam: 10 reps Bridge: 15 reps Bent Knee Raise: 15 reps Ab Set: 10 reps;5 seconds Isometric Hip Flexion: 10 reps;5 seconds Straight Leg Raise: 10 reps  Physical Therapy Assessment and Plan PT Assessment and Plan Clinical Impression Statement: Pt requires vc's to improve hip stabilization with supine clams. Began isometric hip flexion with minimal difficulty to improve hip flexor strength. Pt w/o c/o increased pain throughout session.  PT Plan: Continue to progress per PT POC. Progress clams to SL and begin SL abd/add to improve hip stability.     Problem List Patient Active Problem List  Diagnoses  . Left leg weakness  . Stiffness of joints, not elsewhere classified, multiple sites    PT - End of Session Activity Tolerance: Patient tolerated treatment well General Behavior During Session: [facility] for tasks performed Cognition: [facility] for tasks performed  Antonieta Iba 12/07/2011, 11:17 AM

## 2011-12-08 ENCOUNTER — Ambulatory Visit (HOSPITAL_COMMUNITY)
Admission: RE | Admit: 2011-12-08 | Discharge: 2011-12-08 | Disposition: A | Discharge status: Home / Self Care | Payer: Medicaid Other | Source: Ambulatory Visit | Attending: Orthopedic Surgery | Admitting: Orthopedic Surgery

## 2011-12-08 NOTE — Progress Notes (Signed)
Physical Therapy Treatment Patient Details  Name: Zachary [name] MRN: [id number] Date of Birth: 28-Jun-1971  Today's Date: 12/08/2011 Time: 1018-1100 Time Calculation (min): 42 min Visit#: 4  of 8   Re-eval: 12/29/11 Charges: Therex x 39'  Subjective: Symptoms/Limitations Symptoms: Pt reports 6/10 soreness in low back. Pain Assessment Currently in Pain?: Yes Pain Score:   6 Pain Location: Back Pain Orientation: Proximal   Exercise/Treatments Stretches Active Hamstring Stretch: 3 reps;30 seconds Single Knee to Chest Stretch: 3 reps;30 seconds Stability Clam: 10 reps;Side-lying Isometric Hip Flexion: 15 reps;5 seconds Hip Abduction: 10 reps;Side-lying  Physical Therapy Assessment and Plan PT Assessment and Plan Clinical Impression Statement: HEP given for all exercises. Pt compelted therex with good form and without complaint of increased pain. PT Plan: Hold therapy until January 2013 secondary to insurance coverage per PT plan. Re-eval next visit for insurance.     Problem List Patient Active Problem List  Diagnoses  . Left leg weakness  . Stiffness of joints, not elsewhere classified, multiple sites    PT - End of Session Activity Tolerance: Patient tolerated treatment well General Behavior During Session: [facility] for tasks performed Cognition: [facility] for tasks performed  Antonieta Iba 12/08/2011, 12:23 PM

## 2011-12-09 ENCOUNTER — Ambulatory Visit (HOSPITAL_COMMUNITY): Payer: Medicaid Other | Admitting: Physical Therapy

## 2011-12-22 ENCOUNTER — Inpatient Hospital Stay (HOSPITAL_COMMUNITY): Admission: RE | Admit: 2011-12-22 | Payer: Medicaid Other | Source: Ambulatory Visit | Admitting: Physical Therapy

## 2012-01-24 ENCOUNTER — Ambulatory Visit (HOSPITAL_COMMUNITY)
Admission: RE | Admit: 2012-01-24 | Discharge: 2012-01-24 | Disposition: A | Discharge status: Home / Self Care | Payer: Medicaid Other | Source: Ambulatory Visit | Attending: Orthopedic Surgery | Admitting: Orthopedic Surgery

## 2012-01-24 DIAGNOSIS — M545 Low back pain, unspecified: Secondary | ICD-10-CM | POA: Insufficient documentation

## 2012-01-24 DIAGNOSIS — M25669 Stiffness of unspecified knee, not elsewhere classified: Secondary | ICD-10-CM | POA: Insufficient documentation

## 2012-01-24 DIAGNOSIS — R29898 Other symptoms and signs involving the musculoskeletal system: Secondary | ICD-10-CM

## 2012-01-24 DIAGNOSIS — M6281 Muscle weakness (generalized): Secondary | ICD-10-CM | POA: Insufficient documentation

## 2012-01-24 DIAGNOSIS — M2560 Stiffness of unspecified joint, not elsewhere classified: Secondary | ICD-10-CM

## 2012-01-24 DIAGNOSIS — M25659 Stiffness of unspecified hip, not elsewhere classified: Secondary | ICD-10-CM | POA: Insufficient documentation

## 2012-01-24 DIAGNOSIS — IMO0001 Reserved for inherently not codable concepts without codable children: Secondary | ICD-10-CM | POA: Insufficient documentation

## 2012-01-24 DIAGNOSIS — R262 Difficulty in walking, not elsewhere classified: Secondary | ICD-10-CM | POA: Insufficient documentation

## 2012-01-24 NOTE — Evaluation (Signed)
Physical Therapy Evaluation  Patient Details  Name: Zachary [name] MRN: [id number] Date of Birth: [birth year]  Today's Date: 01/24/2012 Time: 1400-1430 Time Calculation (min): 30 min  Visit#: 5  of 8   Re-eval:   Assessment Diagnosis: back fusion Surgical Date: 05/24/11 Next MD Visit: 01/31/2012 Prior Therapy: three in 2012  Past Medical History: No past medical history on file. Past Surgical History:  Past Surgical History  Procedure Date  . Back surgery   . Appendectomy     Subjective Symptoms/Limitations Symptoms: I'm 100% better but I'm not 100% How long can you sit comfortably?: Zachary [name] states that he is able to sit for an hour and a half but is fidgeting  where he was only able to sit for ten minutes. How long can you stand comfortably?: The patient states that he is able stand without difficulty now where he was only able to stand for five minutes. How long can you walk comfortably?: The patient is able to walk for fourty minutes; ;he was walking twenty minutes. Pain Assessment Currently in Pain?: Yes Pain Score:   3 (Greatest pain in the past week has been a 7 when doing house) Pain Location: Back   Assessment LLE Strength Left Hip Flexion: 5/5 Left Hip Extension: 5/5 Left Hip ADduction: 4/5 Left Knee Flexion: 5/5 Left Knee Extension:  (4+) Left Ankle Dorsiflexion: 5/5 Lumbar AROM Lumbar Flexion: decreased 60% Lumbar Extension: decreased 20% was 80% Lumbar - Right Side Bend: wnl was decreased 20% Lumbar - Left Side Bend: wnl was decreased 20% Lumbar - Right Rotation: wfl Lumbar - Left Rotation: wfl  Exercise/Treatments    Stretches Passive Hamstring Stretch:  (in doorjam x 1') Double Knee to Chest Stretch: 1 rep;30 seconds Prone on Elbows Stretch: 2 reps;20 seconds Quadruped Mid Back Stretch: 1 rep;30 seconds    Physical Therapy Assessment and Plan PT Assessment and Plan Clinical Impression Statement: Pt has improved but continues to be very  tight and have back weakness pt will be seen for three treatments to address these issues. Rehab Potential: Good PT Frequency: Min 3X/week PT Duration:  (one week to work on program) PT Treatment/Interventions: Therapeutic activities;Therapeutic exercise PT Plan: three visits only due to insurance coverage.    Goals Home Exercise Program Pt will Perform Home Exercise Program: Independently PT Short Term Goals PT Short Term Goal 1 - Progress: Met PT Short Term Goal 2: all motions except flexion have been improved PT Short Term Goal 2 - Progress: Partly met PT Short Term Goal 3 - Progress: Met PT Short Term Goal 4 - Progress: Not met PT Short Term Goal 5: new goal as of 2/4- Pain no greater than a 4  Additional PT Short Term Goals?: Yes PT Short Term Goal 6: I in advance HEP PT Short Term Goal 7: I in proper lifting techinques.  Problem List Patient Active Problem List  Diagnoses  . Left leg weakness  . Stiffness of joints, not elsewhere classified, multiple sites    PT - End of Session Activity Tolerance: Patient tolerated treatment well General Behavior During Session: [facility] for tasks performed Cognition: [facility] for tasks performed PT Plan of Care PT Home Exercise Plan: begin prone ex for heel squeeze; slr and  SAR as well as Tband next treatment then functional lifting, lunging for second visit.   RUSSELL,CINDY 01/24/2012, 4:09 PM  Physician Documentation Your signature is required to indicate approval of the treatment plan as stated above.  Please sign and either send electronically  or make a copy of this report for your files and return this physician signed original.   Please mark one 1.__approve of plan  2. ___approve of plan with the following conditions.   ______________________________                                                          _____________________ Physician Signature                                                                                                              Date

## 2012-01-24 NOTE — Patient Instructions (Addendum)
HEP

## 2012-01-24 NOTE — Evaluation (Signed)
Physical Therapy RE-Evaluation  Patient Details  Name: Zachary [name] MRN: [id number] Date of Birth: [birth year]  Today's Date: 01/24/2012 Time: 1400-1430 Time Calculation (min): 30 min  Visit#: 5  of 8   Re-eval:   Assessment Diagnosis: back fusion Surgical Date: 05/24/11 Next MD Visit: 01/31/2012 Prior Therapy: three in 2012  Past Medical History: No past medical history on file. Past Surgical History:  Past Surgical History  Procedure Date  . Back surgery   . Appendectomy     Subjective Symptoms/Limitations Symptoms: I'm 100% better but I'm not 100% How long can you sit comfortably?: Mr. Every states that he is able to sit for an hour and a half but is fidgeting  where he was only able to sit for ten minutes. How long can you stand comfortably?: The patient states that he is able stand without difficulty now where he was only able to stand for five minutes. How long can you walk comfortably?: The patient is able to walk for fourty minutes; ;he was walking twenty minutes. Pain Assessment Currently in Pain?: Yes Pain Score:   3 (Greatest pain in the past week has been a 7 when doing house) Pain Location: Back   Assessment LLE Strength Left Hip Flexion: 5/5 Left Hip Extension: 5/5 Left Hip ADduction: 4/5 Left Knee Flexion: 5/5 Left Knee Extension:  (4+) Left Ankle Dorsiflexion: 5/5 Lumbar AROM Lumbar Flexion: decreased 60% Lumbar Extension: decreased 20% was 80% Lumbar - Right Side Bend: wnl was decreased 20% Lumbar - Left Side Bend: wnl was decreased 20% Lumbar - Right Rotation: wfl Lumbar - Left Rotation: wfl  Exercise/Treatments   Stretches Passive Hamstring Stretch:  (in doorjam x 1') Double Knee to Chest Stretch: 1 rep;30 seconds Prone on Elbows Stretch: 2 reps;20 seconds Quadruped Mid Back Stretch: 1 rep;30 seconds       Physical Therapy Assessment and Plan PT Assessment and Plan Clinical Impression Statement: Pt given HEP for stretching will begin  prone exercises and t-band next visit. PT Frequency: Min 3X/week PT Duration:  (1 wk) PT Plan: three visits only due to insurance coverage.    Goals Home Exercise Program Pt will Perform Home Exercise Program: Independently PT Short Term Goals PT Short Term Goal 1 - Progress: Met PT Short Term Goal 2: all motions except flexion have been improved PT Short Term Goal 2 - Progress: Partly met PT Short Term Goal 3 - Progress: Met PT Short Term Goal 4 - Progress: Not met PT Short Term Goal 5: new goal as of 2/4- Pain no greater than a 4  Additional PT Short Term Goals?: Yes PT Short Term Goal 6: I in advance HEP PT Short Term Goal 7: I in proper lifting techinques.  Problem List Patient Active Problem List  Diagnoses  . Left leg weakness  . Stiffness of joints, not elsewhere classified, multiple sites    PT - End of Session Activity Tolerance: Patient tolerated treatment well General Behavior During Session: [facility] for tasks performed   Sandrea Boer,CINDY 01/24/2012, 2:27 PM  Physician Documentation Your signature is required to indicate approval of the treatment plan as stated above.  Please sign and either send electronically or make a copy of this report for your files and return this physician signed original.   Please mark one 1.__approve of plan  2. ___approve of plan with the following conditions.   ______________________________  _____________________ Physician Signature                                                                                                             Date

## 2012-01-26 ENCOUNTER — Ambulatory Visit (HOSPITAL_COMMUNITY)
Admission: RE | Admit: 2012-01-26 | Discharge: 2012-01-26 | Disposition: A | Discharge status: Home / Self Care | Payer: Medicaid Other | Source: Ambulatory Visit | Attending: Orthopedic Surgery | Admitting: Orthopedic Surgery

## 2012-01-26 NOTE — Progress Notes (Signed)
Physical Therapy Treatment Patient Details  Name: Zachary [name] MRN: [id number] Date of Birth: [birth year]  Today's Date: 01/26/2012 Time: 1478-2956 Time Calculation (min): 58 min Visit#: 6  of 8   Re-eval: 01/28/12 Charges:  therex 53'    Subjective: Symptoms/Limitations Symptoms: Pt. reports he's been walking 2.5 miles everyday the past 4 months, however lately he has not stopped to rest or used an AD.  Pt. states it takes him approx 40 minutes. Pain Assessment Currently in Pain?: Yes Pain Score:   3 Pain Location: Back   Exercise/Treatments Stretches Active Hamstring Stretch: 3 reps;30 seconds;Limitations Active Hamstring Stretch Limitations: with rope Double Knee to Chest Stretch: 2 reps;30 seconds Prone on Elbows Stretch: 2 reps;20 seconds Lumbar Exercises Scapular Retraction: 10 reps;Theraband Theraband Level (Scapular Retraction): Level 3 (Green) Row: 10 reps;Theraband Theraband Level (Row): Level 3 (Green) Shoulder Extension: 10 reps;Theraband Theraband Level (Shoulder Extension): Level 3 (Green) Stability Bridge: 15 reps Isometric Hip Flexion: 15 reps Large Ball Abdominal Isometric: 10 reps;Limitations Large Ball Abdominal Isometric Limitations: w/o ball Large Ball Oblique Isometric: 10 reps Hip Abduction: Side-lying;15 reps Heel Squeeze: Prone;10 reps Single Arm Raise: Prone;10 reps Leg Raise: Prone;10 reps Opposite Arm/Leg Raise: Prone;10 reps  Physical Therapy Assessment and Plan PT Assessment and Plan Clinical Impression Statement: Pt. instructed with long sitting HS stretch, postural tband strengthening, crunches and prone stab exercises.  Pt. was able to demonstrate appropriately.  Progressed isometric abdominals to crunches for rectus abdominus and obliques. PT Plan: Continue X 2 more visits.  Add lunges and begin to work on lifting/body mechanics next visit.     Problem List Patient Active Problem List  Diagnoses  . Left leg weakness  . Stiffness  of joints, not elsewhere classified, multiple sites    PT - End of Session Activity Tolerance: Patient tolerated treatment well General Behavior During Session: [facility] for tasks performed Cognition: [facility] for tasks performed PT Plan of Care PT Home Exercise Plan: Pt. given tband and written instructions for HEP.  Amy B. Bascom Levels, PTA 01/26/2012, 3:06 PM

## 2012-01-27 ENCOUNTER — Ambulatory Visit (HOSPITAL_COMMUNITY): Payer: Medicaid Other | Admitting: Physical Therapy

## 2012-01-27 ENCOUNTER — Telehealth (HOSPITAL_COMMUNITY): Payer: Self-pay

## 2012-01-28 ENCOUNTER — Telehealth (HOSPITAL_COMMUNITY): Payer: Self-pay | Admitting: Physical Therapy

## 2012-01-28 ENCOUNTER — Inpatient Hospital Stay (HOSPITAL_COMMUNITY): Admission: RE | Admit: 2012-01-28 | Payer: Medicaid Other | Source: Ambulatory Visit | Admitting: Physical Therapy

## 2012-04-10 ENCOUNTER — Ambulatory Visit (HOSPITAL_COMMUNITY): Payer: Medicaid Other | Attending: Orthopedic Surgery | Admitting: Physical Therapy

## 2012-04-10 DIAGNOSIS — R262 Difficulty in walking, not elsewhere classified: Secondary | ICD-10-CM | POA: Insufficient documentation

## 2012-04-10 DIAGNOSIS — M25659 Stiffness of unspecified hip, not elsewhere classified: Secondary | ICD-10-CM | POA: Insufficient documentation

## 2012-04-10 DIAGNOSIS — M6281 Muscle weakness (generalized): Secondary | ICD-10-CM | POA: Insufficient documentation

## 2012-04-10 DIAGNOSIS — M545 Low back pain, unspecified: Secondary | ICD-10-CM | POA: Insufficient documentation

## 2012-04-10 DIAGNOSIS — M25669 Stiffness of unspecified knee, not elsewhere classified: Secondary | ICD-10-CM | POA: Insufficient documentation

## 2012-04-10 DIAGNOSIS — IMO0001 Reserved for inherently not codable concepts without codable children: Secondary | ICD-10-CM | POA: Insufficient documentation

## 2012-05-25 ENCOUNTER — Ambulatory Visit (HOSPITAL_COMMUNITY): Payer: Medicaid Other | Attending: Orthopedic Surgery | Admitting: Physical Therapy

## 2012-09-14 ENCOUNTER — Emergency Department (HOSPITAL_COMMUNITY)
Admission: EM | Admit: 2012-09-14 | Discharge: 2012-09-14 | Disposition: A | Discharge status: Home / Self Care | Payer: BC Managed Care – PPO | Attending: Emergency Medicine | Admitting: Emergency Medicine

## 2012-09-14 ENCOUNTER — Encounter (HOSPITAL_COMMUNITY): Payer: Self-pay | Admitting: Emergency Medicine

## 2012-09-14 DIAGNOSIS — Z87891 Personal history of nicotine dependence: Secondary | ICD-10-CM | POA: Insufficient documentation

## 2012-09-14 DIAGNOSIS — B86 Scabies: Secondary | ICD-10-CM | POA: Insufficient documentation

## 2012-09-14 MED ORDER — PERMETHRIN 5 % EX CREA: TOPICAL_CREAM | CUTANEOUS | Status: DC

## 2012-09-14 NOTE — ED Notes (Signed)
Patient has scattered, small, scabbed lesions over thighs (L>R).  They are intermittently puritic.

## 2012-09-14 NOTE — ED Notes (Signed)
Patient has scattered, small, scabbed lesions over thighs (R>L).  They have been intermittently puritic.  Couple states the wife's daughter has been staying with them for over a month and has had contact w/someone w/known scabies.  She has a puritic rash and has not been treated.  They state that their three other children are now developing rash like patient has.

## 2012-09-14 NOTE — ED Notes (Signed)
Pt has rash to the bilateral legs xone week. Pt states family came in to stay with him and they had ? Scabies.

## 2012-09-14 NOTE — ED Provider Notes (Signed)
History     CSN: [id number]  Arrival date & time 09/14/12  1157   First MD Initiated Contact with Patient 09/14/12 1228      Chief Complaint  Patient presents with  . Rash    (Consider location/radiation/quality/duration/timing/severity/associated sxs/prior treatment) HPI Comments: Pt has a step daughter that has come to live with his family.  The child was recently dx with scabies  And has not been treated.  All of the family members have rashes and itching.  Patient is a 41 y.o. male presenting with rash. The history is provided by the patient. No language interpreter was used.  Rash  This is a new problem. The problem has not changed since onset.There has been no fever. Associated symptoms include itching. He has tried nothing for the symptoms.    History reviewed. No pertinent past medical history.  Past Surgical History  Procedure Date  . Back surgery   . Appendectomy     History reviewed. No pertinent family history.  History  Substance Use Topics  . Smoking status: Former Smoker    Quit date: 06/03/2011  . Smokeless tobacco: Not on file  . Alcohol Use: No      Review of Systems  Constitutional: Negative for fever and chills.  Skin: Positive for itching and rash.  All other systems reviewed and are negative.    Allergies  Review of patient's allergies indicates no known allergies.  Home Medications   Current Outpatient Rx  Name Route Sig Dispense Refill  . CYCLOBENZAPRINE HCL 10 MG PO TABS Oral Take 10 mg by mouth 3 (three) times daily.    Marland Kitchen GABAPENTIN 300 MG PO CAPS Oral Take 300 mg by mouth 3 (three) times daily.    Marland Kitchen HYDROCODONE-ACETAMINOPHEN 10-325 MG PO TABS Oral Take 1 tablet by mouth 5 (five) times daily.     Marland Kitchen HYDROCORTISONE 1 % EX CREA Topical Apply 1 application topically daily as needed. Poison ivy    . PERMETHRIN 5 % EX CREA  Apply to entire body other than face - let sit for 14 hours then wash off, may repeat in 1 week if still having  symptoms 60 g 1    BP 122/85  Pulse 73  Temp 98.1 F (36.7 C) (Oral)  Resp 20  Ht 6\' 1"  (1.854 m)  Wt 275 lb (124.739 kg)  BMI 36.28 kg/m2  SpO2 100%  Physical Exam  Nursing note and vitals reviewed. Constitutional: He is oriented to person, place, and time. He appears well-developed and well-nourished.  HENT:  Head: Normocephalic and atraumatic.  Eyes: EOM are normal.  Neck: Normal range of motion.  Cardiovascular: Normal rate, regular rhythm, normal heart sounds and intact distal pulses.   Pulmonary/Chest: Effort normal and breath sounds normal. No respiratory distress.  Abdominal: Soft. He exhibits no distension. There is no tenderness.  Musculoskeletal: Normal range of motion.  Neurological: He is alert and oriented to person, place, and time.  Skin: Skin is warm and dry. Rash noted. Rash is papular. There is erythema.     Psychiatric: He has a normal mood and affect. Judgment normal.    ED Course  Procedures (including critical care time)  Labs Reviewed - No data to display No results found.   1. Scabies       MDM  rx-elimite cream 5%, 60 gm        Evalina Field, [place] 09/14/12 1440

## 2012-09-14 NOTE — ED Notes (Signed)
Patient with no complaints at this time. Respirations even and unlabored. Skin warm/dry. Discharge instructions reviewed with patient at this time. Patient given opportunity to voice concerns/ask questions. Patient discharged at this time and left Emergency Department with steady gait.   

## 2012-09-16 NOTE — ED Provider Notes (Signed)
Medical screening examination/treatment/procedure(s) were performed by non-physician practitioner and as supervising physician I was immediately available for consultation/collaboration.   Carleene Cooper III, MD 09/16/12 [phone]

## 2012-12-16 ENCOUNTER — Encounter (HOSPITAL_COMMUNITY): Payer: Self-pay | Admitting: *Deleted

## 2012-12-16 ENCOUNTER — Emergency Department (HOSPITAL_COMMUNITY): Payer: BC Managed Care – PPO

## 2012-12-16 ENCOUNTER — Emergency Department (HOSPITAL_COMMUNITY)
Admission: EM | Admit: 2012-12-16 | Discharge: 2012-12-16 | Disposition: A | Discharge status: Home / Self Care | Payer: BC Managed Care – PPO | Attending: Emergency Medicine | Admitting: Emergency Medicine

## 2012-12-16 DIAGNOSIS — Z87891 Personal history of nicotine dependence: Secondary | ICD-10-CM | POA: Insufficient documentation

## 2012-12-16 DIAGNOSIS — R109 Unspecified abdominal pain: Secondary | ICD-10-CM | POA: Insufficient documentation

## 2012-12-16 DIAGNOSIS — Z79899 Other long term (current) drug therapy: Secondary | ICD-10-CM | POA: Insufficient documentation

## 2012-12-16 DIAGNOSIS — B9789 Other viral agents as the cause of diseases classified elsewhere: Secondary | ICD-10-CM | POA: Insufficient documentation

## 2012-12-16 DIAGNOSIS — B349 Viral infection, unspecified: Secondary | ICD-10-CM

## 2012-12-16 DIAGNOSIS — R197 Diarrhea, unspecified: Secondary | ICD-10-CM | POA: Insufficient documentation

## 2012-12-16 LAB — COMPREHENSIVE METABOLIC PANEL
ALT: 26 U/L (ref 0–53)
Alkaline Phosphatase: 67 U/L (ref 39–117)
CO2: 28 mEq/L (ref 19–32)
GFR calc Af Amer: 90 mL/min — ABNORMAL LOW (ref >90)
GFR calc non Af Amer: 77 mL/min — ABNORMAL LOW (ref >90)
Glucose, Bld: 96 mg/dL (ref 70–99)
Potassium: 3.4 mEq/L — ABNORMAL LOW (ref 3.5–5.1)
Sodium: 139 mEq/L (ref 135–145)
Total Bilirubin: 0.5 mg/dL (ref 0.3–1.2)

## 2012-12-16 LAB — CBC WITH DIFFERENTIAL/PLATELET
Eosinophils Relative: 2 % (ref 0–5)
Hemoglobin: 14.4 g/dL (ref 13.0–17.0)
Lymphocytes Relative: 37 % (ref 12–46)
Lymphs Abs: 3.8 10*3/uL (ref 0.7–4.0)
MCV: 84.4 fL (ref 78.0–100.0)
Neutrophils Relative %: 54 % (ref 43–77)
Platelets: 248 10*3/uL (ref 150–400)
RBC: 4.73 MIL/uL (ref 4.22–5.81)
WBC: 10.5 10*3/uL (ref 4.0–10.5)

## 2012-12-16 MED ORDER — HYOSCYAMINE SULFATE 0.125 MG PO TABS: 0.1250 mg | ORAL_TABLET | ORAL | Status: DC | PRN

## 2012-12-16 NOTE — ED Provider Notes (Signed)
History     CSN: [id number]  Arrival date & time 12/16/12  4098   First MD Initiated Contact with Patient 12/16/12 1941      Chief Complaint  Patient presents with  . Diarrhea  . Cough  . Abdominal Pain    (Consider location/radiation/quality/duration/timing/severity/associated sxs/prior treatment) Patient is a 41 y.o. male presenting with diarrhea, cough, and abdominal pain. The history is provided by the patient (pt complains of abd cramps and bloating and diarhea).  Diarrhea The primary symptoms include abdominal pain and diarrhea. Primary symptoms do not include fatigue or rash. The illness began 2 days ago. The onset was gradual. The problem has not changed since onset. The abdominal pain began 2 days ago. The abdominal pain has been unchanged since its onset. The abdominal pain is generalized. The abdominal pain does not radiate. The severity of the abdominal pain is 6/10.  The illness does not include chills or back pain. Associated medical issues do not include inflammatory bowel disease.  Cough Pertinent negatives include no chest pain, no chills and no headaches.  Abdominal Pain The primary symptoms of the illness include abdominal pain and diarrhea. The primary symptoms of the illness do not include fatigue.  Symptoms associated with the illness do not include chills, hematuria, frequency or back pain. Significant associated medical issues do not include inflammatory bowel disease.    History reviewed. No pertinent past medical history.  Past Surgical History  Procedure Date  . Back surgery   . Appendectomy     History reviewed. No pertinent family history.  History  Substance Use Topics  . Smoking status: Former Smoker    Quit date: 06/03/2011  . Smokeless tobacco: Not on file  . Alcohol Use: No      Review of Systems  Constitutional: Negative for chills and fatigue.  HENT: Negative for congestion, sinus pressure and ear discharge.   Eyes: Negative for  discharge.  Respiratory: Positive for cough.   Cardiovascular: Negative for chest pain.  Gastrointestinal: Positive for abdominal pain and diarrhea.  Genitourinary: Negative for frequency and hematuria.  Musculoskeletal: Negative for back pain.  Skin: Negative for rash.  Neurological: Negative for seizures and headaches.  Hematological: Negative.   Psychiatric/Behavioral: Negative for hallucinations.    Allergies  Review of patient's allergies indicates no known allergies.  Home Medications   Current Outpatient Rx  Name  Route  Sig  Dispense  Refill  . CYCLOBENZAPRINE HCL 10 MG PO TABS   Oral   Take 10 mg by mouth 3 (three) times daily.         Marland Kitchen GABAPENTIN 300 MG PO CAPS   Oral   Take 300 mg by mouth 3 (three) times daily.         Marland Kitchen HYDROCODONE-ACETAMINOPHEN 10-325 MG PO TABS   Oral   Take 1 tablet by mouth 5 (five) times daily.          Marland Kitchen HYOSCYAMINE SULFATE 0.125 MG PO TABS   Oral   Take 1 tablet (0.125 mg total) by mouth every 4 (four) hours as needed for cramping.   30 tablet   0     BP 109/46  Pulse 77  Temp 97.9 F (36.6 C) (Oral)  Resp 20  Ht 6\' 1"  (1.854 m)  Wt 265 lb (120.203 kg)  BMI 34.96 kg/m2  SpO2 98%  Physical Exam  Constitutional: He is oriented to person, place, and time. He appears well-developed.  HENT:  Head: Normocephalic and atraumatic.  Eyes:  Conjunctivae normal and EOM are normal. No scleral icterus.  Neck: Neck supple. No thyromegaly present.  Cardiovascular: Normal rate and regular rhythm.  Exam reveals no gallop and no friction rub.   No murmur heard. Pulmonary/Chest: No stridor. He has no wheezes. He has no rales. He exhibits no tenderness.  Abdominal: He exhibits distension. There is tenderness. There is no rebound.  Musculoskeletal: Normal range of motion. He exhibits no edema.  Lymphadenopathy:    He has no cervical adenopathy.  Neurological: He is oriented to person, place, and time. Coordination normal.  Skin: No  rash noted. No erythema.  Psychiatric: He has a normal mood and affect. His behavior is normal.    ED Course  Procedures (including critical care time)  Labs Reviewed  CBC WITH DIFFERENTIAL - Abnormal; Notable for the following:    MCHC 36.1 (*)     All other components within normal limits  COMPREHENSIVE METABOLIC PANEL - Abnormal; Notable for the following:    Potassium 3.4 (*)     GFR calc non Af Amer 77 (*)     GFR calc Af Amer 90 (*)     All other components within normal limits  CLOSTRIDIUM DIFFICILE BY PCR   Dg Abd Acute W/chest  12/16/2012  *RADIOLOGY REPORT*  Clinical Data: Abdominal pain, bloating and nausea.  ACUTE ABDOMEN SERIES (ABDOMEN 2 VIEW & CHEST 1 VIEW)  Comparison: 06/16/2011  Findings: The chest demonstrates clear lungs.  No edema or infiltrates are seen.  Abdominal films show no evidence of bowel obstruction or significant ileus.  There are some scattered air- fluid levels present in the colon which is nonspecific but may relate to enteritis.  No free air identified.  Evidence of prior posterior lumbar fusion at L4-5.  No abnormal calcifications are seen.  IMPRESSION: No significant abnormalities.  Scattered air-fluid levels in the colon may relate to enteritis.   Original Report Authenticated By: Irish Lack, M.D.      1. Viral syndrome       MDM          Benny Lennert, MD 12/16/12 2113

## 2012-12-16 NOTE — ED Notes (Signed)
Pt in restroom to get stool sample.

## 2012-12-16 NOTE — ED Notes (Addendum)
Pt states he has had diarrhea,gas, and abdominal since Christmas and a cough. He is also c/o belching up what taste like feces. Pt has a hx of c diff

## 2012-12-16 NOTE — ED Notes (Signed)
Pt discharged. Pt stable at time of discharge. Medications reviewed pt has no questions regarding discharge at this time. Pt voiced understanding of discharge instructions.  

## 2013-10-14 ENCOUNTER — Encounter (HOSPITAL_COMMUNITY): Payer: Self-pay | Admitting: Emergency Medicine

## 2013-10-14 ENCOUNTER — Emergency Department (HOSPITAL_COMMUNITY)
Admission: EM | Admit: 2013-10-14 | Discharge: 2013-10-14 | Disposition: A | Discharge status: Home / Self Care | Payer: BC Managed Care – PPO | Attending: Emergency Medicine | Admitting: Emergency Medicine

## 2013-10-14 DIAGNOSIS — R5381 Other malaise: Secondary | ICD-10-CM | POA: Insufficient documentation

## 2013-10-14 DIAGNOSIS — IMO0001 Reserved for inherently not codable concepts without codable children: Secondary | ICD-10-CM | POA: Insufficient documentation

## 2013-10-14 DIAGNOSIS — Z87891 Personal history of nicotine dependence: Secondary | ICD-10-CM | POA: Insufficient documentation

## 2013-10-14 DIAGNOSIS — R197 Diarrhea, unspecified: Secondary | ICD-10-CM | POA: Insufficient documentation

## 2013-10-14 DIAGNOSIS — Z79899 Other long term (current) drug therapy: Secondary | ICD-10-CM | POA: Insufficient documentation

## 2013-10-14 DIAGNOSIS — R509 Fever, unspecified: Secondary | ICD-10-CM | POA: Insufficient documentation

## 2013-10-14 DIAGNOSIS — Z791 Long term (current) use of non-steroidal anti-inflammatories (NSAID): Secondary | ICD-10-CM | POA: Insufficient documentation

## 2013-10-14 DIAGNOSIS — R51 Headache: Secondary | ICD-10-CM | POA: Insufficient documentation

## 2013-10-14 DIAGNOSIS — R059 Cough, unspecified: Secondary | ICD-10-CM | POA: Insufficient documentation

## 2013-10-14 DIAGNOSIS — R05 Cough: Secondary | ICD-10-CM | POA: Insufficient documentation

## 2013-10-14 LAB — CBC WITH DIFFERENTIAL/PLATELET
Basophils Relative: 0 % (ref 0–1)
Eosinophils Absolute: 0 10*3/uL (ref 0.0–0.7)
MCH: 30.2 pg (ref 26.0–34.0)
MCHC: 35 g/dL (ref 30.0–36.0)
Monocytes Relative: 9 % (ref 3–12)
Neutrophils Relative %: 82 % — ABNORMAL HIGH (ref 43–77)
Platelets: 165 10*3/uL (ref 150–400)

## 2013-10-14 LAB — BASIC METABOLIC PANEL
BUN: 10 mg/dL (ref 6–23)
GFR calc Af Amer: 86 mL/min — ABNORMAL LOW (ref >90)
GFR calc non Af Amer: 74 mL/min — ABNORMAL LOW (ref >90)
Potassium: 3.7 mEq/L (ref 3.5–5.1)
Sodium: 134 mEq/L — ABNORMAL LOW (ref 135–145)

## 2013-10-14 MED ORDER — BENZONATATE 100 MG PO CAPS: 100.0000 mg | ORAL_CAPSULE | Freq: Three times a day (TID) | ORAL | Status: DC

## 2013-10-14 MED ORDER — OSELTAMIVIR PHOSPHATE 75 MG PO CAPS: 75.0000 mg | ORAL_CAPSULE | Freq: Two times a day (BID) | ORAL | Status: DC

## 2013-10-14 MED ORDER — KETOROLAC TROMETHAMINE 30 MG/ML IJ SOLN
30.0000 mg | Freq: Once | INTRAMUSCULAR | Status: AC
  Administered 2013-10-14: 30 mg via INTRAVENOUS
  Filled 2013-10-14: qty 1

## 2013-10-14 MED ORDER — NAPROXEN 500 MG PO TABS: 500.0000 mg | ORAL_TABLET | Freq: Two times a day (BID) | ORAL | Status: DC

## 2013-10-14 MED ORDER — ONDANSETRON HCL 4 MG/2ML IJ SOLN
4.0000 mg | Freq: Once | INTRAMUSCULAR | Status: AC
  Administered 2013-10-14: 4 mg via INTRAVENOUS
  Filled 2013-10-14: qty 2

## 2013-10-14 NOTE — ED Notes (Signed)
In to d/c pt. Pt actively vomiting. edp aware. vo zofran 4mg  iv per dr Fleet Contras, read back and verified.

## 2013-10-14 NOTE — ED Notes (Signed)
N/v/d body aches and weakness x 2 days. nad at this time. N/v x 6 in last 24 hrs and x 10 diarrhea in last 24 hrs.

## 2013-10-14 NOTE — ED Notes (Signed)
Pt c/o body aches, N/V and diarrhea since Friday

## 2013-10-14 NOTE — ED Provider Notes (Signed)
CSN: [id number]     Arrival date & time 10/14/13  1300 History  This chart was scribed for No att. providers found by Blanchard Kelch, ED Scribe. The patient was seen in room APA11/APA11. Patient's care was started at 5:56 PM.    Chief Complaint  Patient presents with  . Emesis  . Diarrhea    HPI  HPI Comments: Zachary [name] is a 42 y.o. male who presents to the Emergency Department complaining of gradually worsening, non-bloody, watery diarrhea that began yesterday. He complains of associated fever, vomiting, fatigue, headache, productive cough and myalgias. He was unable to finish work yesterday due to the pain. He has not yet eaten today. He denies recent travel or antibiotic use or sick contacts. He has a past medical history of back surgery and appendectomy.   The symptoms were gradual in onset, persistent, nothing seems to make them better or worse, no associated objective fevers but he states he has not taken his temperature. He denies any recent ill exposures, denies abdominal pain but does have diffuse myalgias, cough and a headache that occurs when he has a cough. He denies a stiff neck, visual changes, rashes. He did not get a flu shot this year.    History reviewed. No pertinent past medical history. Past Surgical History  Procedure Laterality Date  . Back surgery    . Appendectomy     History reviewed. No pertinent family history. History  Substance Use Topics  . Smoking status: Former Smoker    Quit date: 06/03/2011  . Smokeless tobacco: Not on file  . Alcohol Use: No    Review of Systems  Constitutional: Positive for fever.  Respiratory: Positive for cough.   Gastrointestinal: Positive for vomiting and diarrhea.  Musculoskeletal: Positive for myalgias.  Neurological: Positive for headaches.  All other systems reviewed and are negative.    Allergies  Review of patient's allergies indicates no known allergies.  Home Medications   Current Outpatient Rx   Name  Route  Sig  Dispense  Refill  . benzonatate (TESSALON) 100 MG capsule   Oral   Take 1 capsule (100 mg total) by mouth every 8 (eight) hours.   21 capsule   0   . cyclobenzaprine (FLEXERIL) 10 MG tablet   Oral   Take 10 mg by mouth 3 (three) times daily.         Marland Kitchen gabapentin (NEURONTIN) 300 MG capsule   Oral   Take 300 mg by mouth 3 (three) times daily.         Marland Kitchen HYDROcodone-acetaminophen (NORCO) 10-325 MG per tablet   Oral   Take 1 tablet by mouth 5 (five) times daily.          . hyoscyamine (LEVSIN) 0.125 MG tablet   Oral   Take 1 tablet (0.125 mg total) by mouth every 4 (four) hours as needed for cramping.   30 tablet   0   . naproxen (NAPROSYN) 500 MG tablet   Oral   Take 1 tablet (500 mg total) by mouth 2 (two) times daily with a meal.   30 tablet   0   . oseltamivir (TAMIFLU) 75 MG capsule   Oral   Take 1 capsule (75 mg total) by mouth every 12 (twelve) hours.   10 capsule   0    Triage Vitals: BP 144/70  Pulse 97  Temp(Src) 99.9 F (37.7 C) (Oral)  Resp 18  Ht 6\' 1"  (1.854 m)  Wt 276 lb  11.2 oz (125.51 kg)  BMI 36.51 kg/m2  SpO2 95%  Physical Exam  Nursing note and vitals reviewed. Constitutional: He is oriented to person, place, and time. He appears well-developed and well-nourished. No distress.  HENT:  Head: Normocephalic and atraumatic.  Mildly dry mucous membranes  Eyes: Conjunctivae and EOM are normal. Pupils are equal, round, and reactive to light. Right eye exhibits no discharge. Left eye exhibits no discharge. No scleral icterus.  Neck: Neck supple.  Cardiovascular: Normal rate, regular rhythm and normal heart sounds.   No murmur heard. Pulmonary/Chest: Effort normal and breath sounds normal. No respiratory distress. He has no wheezes. He has no rales.  Very clear lung sounds, no rales, no wheezing, no increased work of breathing, speaks in full sentences, no accessory muscle use  Abdominal: Soft. Bowel sounds are normal. There  is no tenderness. There is no rebound.  Overweight, soft nontender abdomen  Musculoskeletal: Normal range of motion. He exhibits no edema.  Lymphadenopathy:    He has no cervical adenopathy.  Neurological: He is alert and oriented to person, place, and time.  Moves all extremities x4, no deficits, clear speech, normal family.  Skin: Skin is warm and dry.  Psychiatric: He has a normal mood and affect.    ED Course  Procedures (including critical care time)  DIAGNOSTIC STUDIES: Oxygen Saturation is 95% on room air, adequate by my interpretation.    COORDINATION OF CARE: 1:28 PM -Will order CBC and BMP. Patient verbalizes understanding and agrees with treatment plan.    Labs Review Labs Reviewed  CBC WITH DIFFERENTIAL - Abnormal; Notable for the following:    Neutrophils Relative % 82 (*)    Lymphocytes Relative 9 (*)    All other components within normal limits  BASIC METABOLIC PANEL - Abnormal; Notable for the following:    Sodium 134 (*)    Glucose, Bld 127 (*)    GFR calc non Af Amer 74 (*)    GFR calc Af Amer 86 (*)    All other components within normal limits   Imaging Review No results found.  EKG Interpretation   None       MDM   1. Influenza-like illness    Overall the patient is very well appearing, he has no hypoxia, no tachycardia, no hypotension. He has no abnormal lung sounds. Because of his diffuse myalgia with headache, cough, nausea vomiting and watery diarrhea this is likely a viral illness and thus further imaging the chest x-ray or laboratory workup is not indicated. Nursing has ordered laboratories testing before my arrival to see the patient, thus far CBC is normal.  The pt did have some vomiting prior to d/c - improved with fluids and zofran.   Meds given in ED:  Medications  ketorolac (TORADOL) 30 MG/ML injection 30 mg (30 mg Intravenous Given 10/14/13 1400)  ondansetron (ZOFRAN) injection 4 mg (4 mg Intravenous Given 10/14/13 1416)     Discharge Medication List as of 10/14/2013  1:49 PM    START taking these medications   Details  benzonatate (TESSALON) 100 MG capsule Take 1 capsule (100 mg total) by mouth every 8 (eight) hours., Starting 10/14/2013, Until Discontinued, Print    naproxen (NAPROSYN) 500 MG tablet Take 1 tablet (500 mg total) by mouth 2 (two) times daily with a meal., Starting 10/14/2013, Until Discontinued, Print    oseltamivir (TAMIFLU) 75 MG capsule Take 1 capsule (75 mg total) by mouth every 12 (twelve) hours., Starting 10/14/2013, Until Discontinued, Print  I personally performed the services described in this documentation, which was scribed in my presence. The recorded information has been reviewed and is accurate.       Vida Roller, MD 10/14/13 [phone]

## 2014-04-07 ENCOUNTER — Encounter (HOSPITAL_COMMUNITY): Payer: Self-pay | Admitting: Emergency Medicine

## 2014-04-07 ENCOUNTER — Emergency Department (HOSPITAL_COMMUNITY)
Admission: EM | Admit: 2014-04-07 | Discharge: 2014-04-07 | Disposition: A | Discharge status: Home / Self Care | Payer: BC Managed Care – PPO | Attending: Emergency Medicine | Admitting: Emergency Medicine

## 2014-04-07 DIAGNOSIS — K047 Periapical abscess without sinus: Secondary | ICD-10-CM

## 2014-04-07 DIAGNOSIS — Z79899 Other long term (current) drug therapy: Secondary | ICD-10-CM | POA: Insufficient documentation

## 2014-04-07 DIAGNOSIS — Z792 Long term (current) use of antibiotics: Secondary | ICD-10-CM | POA: Insufficient documentation

## 2014-04-07 DIAGNOSIS — Z87891 Personal history of nicotine dependence: Secondary | ICD-10-CM | POA: Insufficient documentation

## 2014-04-07 MED ORDER — HYDROCODONE-ACETAMINOPHEN 5-325 MG PO TABS
1.0000 | ORAL_TABLET | Freq: Once | ORAL | Status: AC
  Administered 2014-04-07: 1 via ORAL
  Filled 2014-04-07: qty 1

## 2014-04-07 MED ORDER — CLINDAMYCIN HCL 300 MG PO CAPS: 300.0000 mg | ORAL_CAPSULE | Freq: Four times a day (QID) | ORAL | Status: DC

## 2014-04-07 MED ORDER — CLINDAMYCIN HCL 150 MG PO CAPS
300.0000 mg | ORAL_CAPSULE | Freq: Once | ORAL | Status: AC
  Administered 2014-04-07: 300 mg via ORAL
  Filled 2014-04-07: qty 2

## 2014-04-07 MED ORDER — HYDROCODONE-ACETAMINOPHEN 5-325 MG PO TABS: 1.0000 | ORAL_TABLET | ORAL | Status: DC | PRN

## 2014-04-07 NOTE — Discharge Instructions (Signed)
Dental Care and Dentist Visits Dental care supports good overall health. Regular dental visits can also help you avoid dental pain, bleeding, infection, and other more serious health problems in the future. It is important to keep the mouth healthy because diseases in the teeth, gums, and other oral tissues can spread to other areas of the body. Some problems, such as diabetes, heart disease, and pre-term labor have been associated with poor oral health.  See your dentist every 6 months. If you experience emergency problems such as a toothache or broken tooth, go to the dentist right away. If you see your dentist regularly, you may catch problems early. It is easier to be treated for problems in the early stages.  WHAT TO EXPECT AT A DENTIST VISIT  Your dentist will look for many common oral health problems and recommend proper treatment. At your regular dental visit, you can expect:  Gentle cleaning of the teeth and gums. This includes scraping and polishing. This helps to remove the sticky substance around the teeth and gums (plaque). Plaque forms in the mouth shortly after eating. Over time, plaque hardens on the teeth as tartar. If tartar is not removed regularly, it can cause problems. Cleaning also helps remove stains.  Periodic X-rays. These pictures of the teeth and supporting bone will help your dentist assess the health of your teeth.  Periodic fluoride treatments. Fluoride is a natural mineral shown to help strengthen teeth. Fluoride treatmentinvolves applying a fluoride gel or varnish to the teeth. It is most commonly done in children.  Examination of the mouth, tongue, jaws, teeth, and gums to look for any oral health problems, such as:  Cavities (dental caries). This is decay on the tooth caused by plaque, sugar, and acid in the mouth. It is best to catch a cavity when it is small.  Inflammation of the gums caused by plaque buildup (gingivitis).  Problems with the mouth or malformed  or misaligned teeth.  Oral cancer or other diseases of the soft tissues or jaws. KEEP YOUR TEETH AND GUMS HEALTHY For healthy teeth and gums, follow these general guidelines as well as your dentist's specific advice:  Have your teeth professionally cleaned at the dentist every 6 months.  Brush twice daily with a fluoride toothpaste.  Floss your teeth daily.  Ask your dentist if you need fluoride supplements, treatments, or fluoride toothpaste.  Eat a healthy diet. Reduce foods and drinks with added sugar.  Avoid smoking. TREATMENT FOR ORAL HEALTH PROBLEMS If you have oral health problems, treatment varies depending on the conditions present in your teeth and gums.  Your caregiver will most likely recommend good oral hygiene at each visit.  For cavities, gingivitis, or other oral health disease, your caregiver will perform a procedure to treat the problem. This is typically done at a separate appointment. Sometimes your caregiver will refer you to another dental specialist for specific tooth problems or for surgery. SEEK IMMEDIATE DENTAL CARE IF:  You have pain, bleeding, or soreness in the gum, tooth, jaw, or mouth area.  A permanent tooth becomes loose or separated from the gum socket.  You experience a blow or injury to the mouth or jaw area. Document Released: 08/18/2011 Document Revised: 02/28/2012 Document Reviewed: 08/18/2011 [facility] Patient Information 2014 [place], [place].  Dental Abscess A dental abscess is a collection of infected fluid (pus) from a bacterial infection in the inner part of the tooth (pulp). It usually occurs at the end of the tooth's root.  CAUSES  Severe tooth decay. °· Trauma to the tooth that allows bacteria to enter into the pulp, such as a broken or chipped tooth. °SYMPTOMS  °· Severe pain in and around the infected tooth. °· Swelling and redness around the abscessed tooth or in the mouth or face. °· Tenderness. °· Pus drainage. °· Bad  breath. °· Bitter taste in the mouth. °· Difficulty swallowing. °· Difficulty opening the mouth. °· Nausea. °· Vomiting. °· Chills. °· Swollen neck glands. °DIAGNOSIS  °· A medical and dental history will be taken. °· An examination will be performed by tapping on the abscessed tooth. °· X-rays may be taken of the tooth to identify the abscess. °TREATMENT °The goal of treatment is to eliminate the infection. You may be prescribed antibiotic medicine to stop the infection from spreading. A root canal may be performed to save the tooth. If the tooth cannot be saved, it may be pulled (extracted) and the abscess may be drained.  °HOME CARE INSTRUCTIONS °· Only take over-the-counter or prescription medicines for pain, fever, or discomfort as directed by your caregiver. °· Rinse your mouth (gargle) often with salt water (¼ tsp salt in 8 oz [250 ml] of warm water) to relieve pain or swelling. °· Do not drive after taking pain medicine (narcotics). °· Do not apply heat to the outside of your face. °· Return to your dentist for further treatment as directed. °SEEK MEDICAL CARE IF: °· Your pain is not helped by medicine. °· Your pain is getting worse instead of better. °SEEK IMMEDIATE MEDICAL CARE IF: °· You have a fever or persistent symptoms for more than 2 3 days. °· You have a fever and your symptoms suddenly get worse. °· You have chills or a very bad headache. °· You have problems breathing or swallowing. °· You have trouble opening your mouth. °· You have swelling in the neck or around the eye. °Document Released: 12/06/2005 Document Revised: 08/30/2012 Document Reviewed: 03/16/2011 °Exit[employer]® Patient Information ©2014 [employer]. ° °

## 2014-04-07 NOTE — ED Provider Notes (Signed)
CSN: [id number]     Arrival date & time 04/07/14  0341 History   First MD Initiated Contact with Patient 04/07/14 0410     Chief Complaint  Patient presents with  . Facial Swelling     (Consider location/radiation/quality/duration/timing/severity/associated sxs/prior Treatment) HPI Patient presents with right jaw swelling and tenderness. He has multiple missing teeth and poor dentition. He is concerned he may have a dental abscess. He's had no fevers or chills. He has no intraoral swelling but states that he has had purulent liquid in his mouth.  hHistory reviewed. No pertinent past medical history. Past Surgical History  Procedure Laterality Date  . Back surgery    . Appendectomy     No family history on file. History  Substance Use Topics  . Smoking status: Former Smoker    Quit date: 06/03/2011  . Smokeless tobacco: Not on file  . Alcohol Use: No    Review of Systems  Constitutional: Negative for fever and chills.  HENT: Positive for dental problem and facial swelling. Negative for ear pain and sinus pressure.   Respiratory: Negative for chest tightness and shortness of breath.   Cardiovascular: Negative for chest pain, palpitations and leg swelling.  Gastrointestinal: Negative for nausea, vomiting and abdominal pain.  Musculoskeletal: Negative for back pain, neck pain and neck stiffness.  Skin: Negative for rash.  Neurological: Negative for dizziness, weakness, numbness and headaches.  All other systems reviewed and are negative.     Allergies  Review of patient's allergies indicates no known allergies.  Home Medications   Prior to Admission medications   Medication Sig Start Date End Date Taking? Authorizing Provider  benzonatate (TESSALON) 100 MG capsule Take 1 capsule (100 mg total) by mouth every 8 (eight) hours. 10/14/13   Johnna Acosta, MD  clindamycin (CLEOCIN) 300 MG capsule Take 1 capsule (300 mg total) by mouth 4 (four) times daily. X 7 days 04/07/14    Julianne Rice, MD  cyclobenzaprine (FLEXERIL) 10 MG tablet Take 10 mg by mouth 3 (three) times daily.    Historical Provider, MD  gabapentin (NEURONTIN) 300 MG capsule Take 300 mg by mouth 3 (three) times daily.    Historical Provider, MD  HYDROcodone-acetaminophen (NORCO) 10-325 MG per tablet Take 1 tablet by mouth 5 (five) times daily.     Historical Provider, MD  HYDROcodone-acetaminophen (NORCO) 5-325 MG per tablet Take 1 tablet by mouth every 4 (four) hours as needed. 04/07/14   Julianne Rice, MD  hyoscyamine (LEVSIN) 0.125 MG tablet Take 1 tablet (0.125 mg total) by mouth every 4 (four) hours as needed for cramping. 12/16/12   Maudry Diego, MD  naproxen (NAPROSYN) 500 MG tablet Take 1 tablet (500 mg total) by mouth 2 (two) times daily with a meal. 10/14/13   Johnna Acosta, MD  oseltamivir (TAMIFLU) 75 MG capsule Take 1 capsule (75 mg total) by mouth every 12 (twelve) hours. 10/14/13   Johnna Acosta, MD   BP 111/79  Pulse 77  Temp(Src) 97.5 F (36.4 C) (Oral)  Resp 20  Ht 6\' 1"  (1.854 m)  Wt 250 lb (113.399 kg)  BMI 32.99 kg/m2  SpO2 96% Physical Exam  Nursing note and vitals reviewed. Constitutional: He is oriented to person, place, and time. He appears well-developed and well-nourished. No distress.  HENT:  Head: Normocephalic and atraumatic.  Mouth/Throat: Oropharynx is clear and moist.  Patient with swelling and tenderness to the angle of the right side of the mandible. Intraorally I do  not appreciate any abscesses he does have diffusely poor dentition.  Eyes: EOM are normal. Pupils are equal, round, and reactive to light.  Neck: Normal range of motion. Neck supple.  Cardiovascular: Normal rate and regular rhythm.   Pulmonary/Chest: Effort normal and breath sounds normal. No respiratory distress. He has no wheezes. He has no rales.  Abdominal: Soft. Bowel sounds are normal. He exhibits no distension and no mass. There is no tenderness. There is no rebound and no  guarding.  Musculoskeletal: Normal range of motion. He exhibits no edema and no tenderness.  Lymphadenopathy:    He has no cervical adenopathy.  Neurological: He is alert and oriented to person, place, and time.  Skin: Skin is warm and dry. No rash noted. No erythema.  Psychiatric: He has a normal mood and affect. His behavior is normal.    ED Course  Procedures (including critical care time) Labs Review Labs Reviewed - No data to display  Imaging Review No results found.   EKG Interpretation None      MDM   Final diagnoses:  Dental abscess    Start on antibiotics and have encouraged the patient to followup with his dentist as soon as possible. Return precautions have been given and the patient has voiced understanding.    Julianne Rice, MD 04/07/14 [phone]

## 2014-04-07 NOTE — ED Notes (Signed)
Pt states he feels like he has an infection on the left lower jaw where he has a tooth missing.

## 2014-08-13 ENCOUNTER — Ambulatory Visit: Payer: BC Managed Care – PPO | Admitting: Urology

## 2015-04-13 ENCOUNTER — Encounter (HOSPITAL_COMMUNITY): Payer: Self-pay | Admitting: Emergency Medicine

## 2015-04-13 ENCOUNTER — Emergency Department (HOSPITAL_COMMUNITY)
Admission: EM | Admit: 2015-04-13 | Discharge: 2015-04-13 | Disposition: A | Discharge status: Home / Self Care | Payer: BLUE CROSS/BLUE SHIELD | Attending: Emergency Medicine | Admitting: Emergency Medicine

## 2015-04-13 DIAGNOSIS — Z792 Long term (current) use of antibiotics: Secondary | ICD-10-CM | POA: Insufficient documentation

## 2015-04-13 DIAGNOSIS — Z87891 Personal history of nicotine dependence: Secondary | ICD-10-CM | POA: Diagnosis not present

## 2015-04-13 DIAGNOSIS — Z791 Long term (current) use of non-steroidal anti-inflammatories (NSAID): Secondary | ICD-10-CM | POA: Diagnosis not present

## 2015-04-13 DIAGNOSIS — Z79899 Other long term (current) drug therapy: Secondary | ICD-10-CM | POA: Diagnosis not present

## 2015-04-13 DIAGNOSIS — M62838 Other muscle spasm: Secondary | ICD-10-CM | POA: Diagnosis not present

## 2015-04-13 DIAGNOSIS — M542 Cervicalgia: Secondary | ICD-10-CM | POA: Diagnosis present

## 2015-04-13 MED ORDER — KETOROLAC TROMETHAMINE 60 MG/2ML IM SOLN
60.0000 mg | Freq: Once | INTRAMUSCULAR | Status: AC
  Administered 2015-04-13: 60 mg via INTRAMUSCULAR
  Filled 2015-04-13: qty 2

## 2015-04-13 MED ORDER — METHOCARBAMOL 500 MG PO TABS: 500.0000 mg | ORAL_TABLET | Freq: Two times a day (BID) | ORAL | Status: DC | PRN

## 2015-04-13 MED ORDER — IBUPROFEN 800 MG PO TABS: 800.0000 mg | ORAL_TABLET | Freq: Three times a day (TID) | ORAL | Status: DC

## 2015-04-13 MED ORDER — HYDROCODONE-ACETAMINOPHEN 5-325 MG PO TABS: 2.0000 | ORAL_TABLET | ORAL | Status: DC | PRN

## 2015-04-13 NOTE — Discharge Instructions (Signed)
Please call your doctor for a followup appointment within 24-48 hours. When you talk to your doctor please let them know that you were seen in the emergency department and have them acquire all of your records so that they can discuss the findings with you and formulate a treatment plan to fully care for your new and ongoing problems. ° °
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
## 2015-07-01 ENCOUNTER — Emergency Department (HOSPITAL_COMMUNITY)
Admission: EM | Admit: 2015-07-01 | Discharge: 2015-07-01 | Disposition: A | Discharge status: Home / Self Care | Payer: BLUE CROSS/BLUE SHIELD | Attending: Emergency Medicine | Admitting: Emergency Medicine

## 2015-07-01 ENCOUNTER — Encounter (HOSPITAL_COMMUNITY): Payer: Self-pay | Admitting: Emergency Medicine

## 2015-07-01 DIAGNOSIS — S8991XA Unspecified injury of right lower leg, initial encounter: Secondary | ICD-10-CM | POA: Diagnosis present

## 2015-07-01 DIAGNOSIS — Y9367 Activity, basketball: Secondary | ICD-10-CM | POA: Diagnosis not present

## 2015-07-01 DIAGNOSIS — Y998 Other external cause status: Secondary | ICD-10-CM | POA: Diagnosis not present

## 2015-07-01 DIAGNOSIS — S86111A Strain of other muscle(s) and tendon(s) of posterior muscle group at lower leg level, right leg, initial encounter: Secondary | ICD-10-CM

## 2015-07-01 DIAGNOSIS — Y92310 Basketball court as the place of occurrence of the external cause: Secondary | ICD-10-CM | POA: Insufficient documentation

## 2015-07-01 DIAGNOSIS — S86811A Strain of other muscle(s) and tendon(s) at lower leg level, right leg, initial encounter: Secondary | ICD-10-CM | POA: Insufficient documentation

## 2015-07-01 DIAGNOSIS — Z791 Long term (current) use of non-steroidal anti-inflammatories (NSAID): Secondary | ICD-10-CM | POA: Insufficient documentation

## 2015-07-01 DIAGNOSIS — Z79899 Other long term (current) drug therapy: Secondary | ICD-10-CM | POA: Diagnosis not present

## 2015-07-01 DIAGNOSIS — Z87891 Personal history of nicotine dependence: Secondary | ICD-10-CM | POA: Diagnosis not present

## 2015-07-01 DIAGNOSIS — Z792 Long term (current) use of antibiotics: Secondary | ICD-10-CM | POA: Diagnosis not present

## 2015-07-01 DIAGNOSIS — X58XXXA Exposure to other specified factors, initial encounter: Secondary | ICD-10-CM | POA: Diagnosis not present

## 2015-07-01 MED ORDER — HYDROCODONE-ACETAMINOPHEN 7.5-325 MG PO TABS: 1.0000 | ORAL_TABLET | Freq: Four times a day (QID) | ORAL | Status: DC | PRN

## 2015-07-01 MED ORDER — NAPROXEN 500 MG PO TABS: 500.0000 mg | ORAL_TABLET | Freq: Two times a day (BID) | ORAL | Status: DC

## 2015-07-01 NOTE — ED Notes (Signed)
Pt. Reports pain to right calf while playing basketball. Pt. Reports feeling a pop and having pain when straightening leg.

## 2015-07-01 NOTE — Discharge Instructions (Signed)
Muscle Tear °A muscle tear is usually caused by over-exertion or stretching. The muscle often takes a while to heal. Muscle tears require 3 to 4 weeks to heal completely. A history of the injury and a physical exam may be performed. Sometimes, the injury is identified with radiographs and an MRI. Treatment for muscle injuries includes: °· Resting and protecting the affected area until pain with motion is gone. °· Putting ice on the injured area. °¨ Put ice in a bag. °¨ Place a towel between your skin and the bag. °¨ Leave the ice on for 15 to 20 minutes, 3 to 4 times a day. °¨ After two days, you can use heat to relieve spasms. °· Using compression wraps to help control swelling and limit movement. °· Raising (elevate) the injured area above the level of the heart (if possible) for the first 1 to 2 days after the injury. °· Medicine may be prescribed to reduce pain and inflammation. °Avoid strenuous activities that bring on muscle pain. Exercises to strengthen and stretch the injured muscle can help heal the muscle and prevent repeated injury. After the pain and swelling are gone, you may begin gradual strengthening exercises. Begin range-of-motion exercises and gentle stretching after 3 to 4 days of rest.  °SEEK MEDICAL CARE IF:  °Your injured muscle is not improving after 1 week of treatment. °Document Released: 01/13/2005 Document Revised: 02/28/2012 Document Reviewed: 06/20/2009 °[employer]® Patient Information ©2015 [employer]. This information is not intended to replace advice given to you by your health care provider. Make sure you discuss any questions you have with your health care provider. ° °

## 2015-07-04 NOTE — ED Provider Notes (Signed)
CSN: [id number]     Arrival date & time 07/01/15  1912 History   First MD Initiated Contact with Patient 07/01/15 1928     Chief Complaint  Patient presents with  . Leg Pain     (Consider location/radiation/quality/duration/timing/severity/associated sxs/prior Treatment) HPI   Zachary [name] is a 44 y.o. male who presents to the Emergency Department complaining of sudden onset of right lower leg pain.  He reports playing basketball and pivoting suddenly when he felt a sharp, burning pain to his calf.  Pain is worse with weight bearing and flexing of his foot.  He also notes hearing a "pop".  Denies any therapy prior to arrival, redness, numbness or weakness of the leg.     History reviewed. No pertinent past medical history. Past Surgical History  Procedure Laterality Date  . Back surgery    . Appendectomy     No family history on file. History  Substance Use Topics  . Smoking status: Former Smoker    Quit date: 06/03/2011  . Smokeless tobacco: Not on file  . Alcohol Use: No    Review of Systems  Constitutional: Negative for fever and chills.  Genitourinary: Negative for dysuria and difficulty urinating.  Musculoskeletal: Positive for myalgias (right lower leg pain). Negative for joint swelling, arthralgias and neck pain.  Skin: Negative for color change and wound.  All other systems reviewed and are negative.     Allergies  Review of patient's allergies indicates no known allergies.  Home Medications   Prior to Admission medications   Medication Sig Start Date End Date Taking? Authorizing Provider  benzonatate (TESSALON) 100 MG capsule Take 1 capsule (100 mg total) by mouth every 8 (eight) hours. 10/14/13   Noemi Chapel, MD  clindamycin (CLEOCIN) 300 MG capsule Take 1 capsule (300 mg total) by mouth 4 (four) times daily. X 7 days 04/07/14   Julianne Rice, MD  gabapentin (NEURONTIN) 300 MG capsule Take 300 mg by mouth 3 (three) times daily.    Historical Provider,  MD  HYDROcodone-acetaminophen (NORCO) 7.5-325 MG per tablet Take 1 tablet by mouth every 6 (six) hours as needed for moderate pain. 07/01/15   Baley Lorimer, PA-C  hyoscyamine (LEVSIN) 0.125 MG tablet Take 1 tablet (0.125 mg total) by mouth every 4 (four) hours as needed for cramping. 12/16/12   Milton Ferguson, MD  ibuprofen (ADVIL,MOTRIN) 800 MG tablet Take 1 tablet (800 mg total) by mouth 3 (three) times daily. 04/13/15   Noemi Chapel, MD  methocarbamol (ROBAXIN) 500 MG tablet Take 1 tablet (500 mg total) by mouth 2 (two) times daily as needed for muscle spasms. 04/13/15   Noemi Chapel, MD  naproxen (NAPROSYN) 500 MG tablet Take 1 tablet (500 mg total) by mouth 2 (two) times daily with a meal. 07/01/15   Nera Haworth, PA-C  oseltamivir (TAMIFLU) 75 MG capsule Take 1 capsule (75 mg total) by mouth every 12 (twelve) hours. 10/14/13   Noemi Chapel, MD   BP 110/80 mmHg  Pulse 80  Temp(Src) 98.7 F (37.1 C) (Oral)  Resp 18  Ht 6\' 1"  (1.854 m)  Wt 270 lb (122.471 kg)  BMI 35.63 kg/m2  SpO2 99% Physical Exam  Constitutional: He is oriented to person, place, and time. He appears well-developed and well-nourished. No distress.  HENT:  Head: Normocephalic and atraumatic.  Cardiovascular: Normal rate, regular rhythm, normal heart sounds and intact distal pulses.   Pulmonary/Chest: Effort normal and breath sounds normal. No respiratory distress.  Musculoskeletal: He exhibits  edema and tenderness.   Localized ttp of posterior right lower leg.  Negative Thompson's test.  DP pulse is brisk,distal sensation intact.  No erythema, abrasion, bruising or bony deformity.  Compartments soft.  Neurological: He is alert and oriented to person, place, and time. He exhibits normal muscle tone. Coordination normal.  Skin: Skin is warm and dry.  Nursing note and vitals reviewed.   ED Course  Procedures (including critical care time) Labs Review Labs Reviewed - No data to display  Imaging Review No results  found.   EKG Interpretation None      MDM   Final diagnoses:  Gastrocnemius tear, right, initial encounter    Pt with likely a muscle tear of the gastrocnemius muscle. Achilles tendon intact.  No fall to indicate need for imaging at this time.  Remains NV intact.  Pt agrees to symptomatic tx and close orthopedic f/u.  Crutches given and advised to remain non-wt bearing.      Kem Parkinson, PA-C 07/04/15 1613  Nat Christen, MD 07/05/15 [phone]

## 2015-07-19 ENCOUNTER — Emergency Department (HOSPITAL_COMMUNITY)
Admission: EM | Admit: 2015-07-19 | Discharge: 2015-07-19 | Disposition: A | Discharge status: Home / Self Care | Payer: BLUE CROSS/BLUE SHIELD | Attending: Emergency Medicine | Admitting: Emergency Medicine

## 2015-07-19 ENCOUNTER — Encounter (HOSPITAL_COMMUNITY): Payer: Self-pay | Admitting: *Deleted

## 2015-07-19 DIAGNOSIS — Z79899 Other long term (current) drug therapy: Secondary | ICD-10-CM | POA: Insufficient documentation

## 2015-07-19 DIAGNOSIS — Z791 Long term (current) use of non-steroidal anti-inflammatories (NSAID): Secondary | ICD-10-CM | POA: Insufficient documentation

## 2015-07-19 DIAGNOSIS — Z87891 Personal history of nicotine dependence: Secondary | ICD-10-CM | POA: Diagnosis not present

## 2015-07-19 DIAGNOSIS — M25571 Pain in right ankle and joints of right foot: Secondary | ICD-10-CM | POA: Insufficient documentation

## 2015-07-19 DIAGNOSIS — R2241 Localized swelling, mass and lump, right lower limb: Secondary | ICD-10-CM | POA: Insufficient documentation

## 2015-07-19 DIAGNOSIS — M25471 Effusion, right ankle: Secondary | ICD-10-CM

## 2015-07-19 MED ORDER — OXYCODONE-ACETAMINOPHEN 5-325 MG PO TABS: 1.0000 | ORAL_TABLET | Freq: Once | ORAL | Status: DC

## 2015-07-19 NOTE — Discharge Instructions (Signed)
Wear cam walker for comfort. Follow-up with Dr. Luna Glasgow as directed previously. Return here for any new or worsening symptoms.

## 2015-07-19 NOTE — ED Notes (Addendum)
Pt reporting pain in right ankle for past couple days. Reporting recent injury to right calf muscle, states that since that time he's noticed swelling in ankle, but pain began about 2 days ago.

## 2015-07-19 NOTE — ED Provider Notes (Signed)
CSN: [id number]     Arrival date & time 07/19/15  2039 History   First MD Initiated Contact with Patient 07/19/15 2047     Chief Complaint  Patient presents with  . Ankle Pain     (Consider location/radiation/quality/duration/timing/severity/associated sxs/prior Treatment) The history is provided by the patient and medical records.    44 year old male with no significant past medical history presenting to the ED for right ankle pain. Patient tore his right calf muscle approximate 2 weeks ago and just recently started walking on his right foot again. He states since this time he has had some intermittent swelling in his right ankle. He denies any new injury, trauma, or falls. States over the past 2 days right ankle has become increasingly more painful, worse with weightbearing and ambulation. He denies any numbness or weakness of right leg.  VSS.  History reviewed. No pertinent past medical history. Past Surgical History  Procedure Laterality Date  . Back surgery    . Appendectomy     History reviewed. No pertinent family history. History  Substance Use Topics  . Smoking status: Former Smoker    Quit date: 06/03/2011  . Smokeless tobacco: Not on file  . Alcohol Use: No    Review of Systems  Musculoskeletal: Positive for joint swelling and arthralgias.  All other systems reviewed and are negative.     Allergies  Review of patient's allergies indicates no known allergies.  Home Medications   Prior to Admission medications   Medication Sig Start Date End Date Taking? Authorizing Provider  benzonatate (TESSALON) 100 MG capsule Take 1 capsule (100 mg total) by mouth every 8 (eight) hours. 10/14/13   Noemi Chapel, MD  clindamycin (CLEOCIN) 300 MG capsule Take 1 capsule (300 mg total) by mouth 4 (four) times daily. X 7 days 04/07/14   Julianne Rice, MD  gabapentin (NEURONTIN) 300 MG capsule Take 300 mg by mouth 3 (three) times daily.    Historical Provider, MD   HYDROcodone-acetaminophen (NORCO) 7.5-325 MG per tablet Take 1 tablet by mouth every 6 (six) hours as needed for moderate pain. 07/01/15   Tammy Triplett, PA-C  hyoscyamine (LEVSIN) 0.125 MG tablet Take 1 tablet (0.125 mg total) by mouth every 4 (four) hours as needed for cramping. 12/16/12   Milton Ferguson, MD  ibuprofen (ADVIL,MOTRIN) 800 MG tablet Take 1 tablet (800 mg total) by mouth 3 (three) times daily. 04/13/15   Noemi Chapel, MD  methocarbamol (ROBAXIN) 500 MG tablet Take 1 tablet (500 mg total) by mouth 2 (two) times daily as needed for muscle spasms. 04/13/15   Noemi Chapel, MD  naproxen (NAPROSYN) 500 MG tablet Take 1 tablet (500 mg total) by mouth 2 (two) times daily with a meal. 07/01/15   Tammy Triplett, PA-C  oseltamivir (TAMIFLU) 75 MG capsule Take 1 capsule (75 mg total) by mouth every 12 (twelve) hours. 10/14/13   Noemi Chapel, MD   BP 117/65 mmHg  Pulse 101  Temp(Src) 98.8 F (37.1 C) (Oral)  Resp 18  Ht 6\' 1"  (1.854 m)  Wt 270 lb (122.471 kg)  BMI 35.63 kg/m2  SpO2 96%   Physical Exam  Constitutional: He is oriented to person, place, and time. He appears well-developed and well-nourished. No distress.  HENT:  Head: Normocephalic and atraumatic.  Mouth/Throat: Oropharynx is clear and moist.  Eyes: Conjunctivae and EOM are normal. Pupils are equal, round, and reactive to light.  Neck: Normal range of motion. Neck supple.  Cardiovascular: Normal rate, regular rhythm and  normal heart sounds.   Pulmonary/Chest: Effort normal and breath sounds normal. No respiratory distress. He has no wheezes.  Musculoskeletal: Normal range of motion.  Mild, generalized swelling of right ankle; no noted bony deformity; full ROM maintained; calf non-tender; Achilles tendon remains intact; leg is neurovascularly intact  Neurological: He is alert and oriented to person, place, and time.  Skin: Skin is warm and dry. He is not diaphoretic.  Psychiatric: He has a normal mood and affect.  Nursing  note and vitals reviewed.   ED Course  Procedures (including critical care time) Labs Review Labs Reviewed - No data to display  Imaging Review No results found.   EKG Interpretation None      MDM   Final diagnoses:  Right ankle pain  Right ankle swelling   44 year old male here with right ankle pain and swelling. Swelling and pain are likely positional as patient recently began walking on his right foot again after 2 weeks on crutches. Foot remains NVI.  Achilles tendon still appears normal, calf non-tender at this time.  I have applied a CAM walker for comfort. Patient to follow-up with orthopedics.  Discussed plan with patient, he/she acknowledged understanding and agreed with plan of care.  Return precautions given for new or worsening symptoms.  Larene Pickett, PA-C 07/19/15 2116  Daleen Bo, MD 07/19/15 [phone]

## 2015-10-28 ENCOUNTER — Ambulatory Visit (INDEPENDENT_AMBULATORY_CARE_PROVIDER_SITE_OTHER): Payer: BLUE CROSS/BLUE SHIELD | Admitting: Family

## 2015-10-28 ENCOUNTER — Other Ambulatory Visit (INDEPENDENT_AMBULATORY_CARE_PROVIDER_SITE_OTHER): Payer: BLUE CROSS/BLUE SHIELD

## 2015-10-28 ENCOUNTER — Encounter: Payer: Self-pay | Admitting: Family

## 2015-10-28 ENCOUNTER — Telehealth: Payer: Self-pay | Admitting: Family

## 2015-10-28 VITALS — BP 118/78 | HR 61 | Temp 97.9°F | Resp 18 | Ht 73.0 in | Wt 271.4 lb

## 2015-10-28 DIAGNOSIS — R51 Headache: Secondary | ICD-10-CM

## 2015-10-28 DIAGNOSIS — Z23 Encounter for immunization: Secondary | ICD-10-CM | POA: Diagnosis not present

## 2015-10-28 DIAGNOSIS — K469 Unspecified abdominal hernia without obstruction or gangrene: Secondary | ICD-10-CM | POA: Diagnosis not present

## 2015-10-28 DIAGNOSIS — R519 Headache, unspecified: Secondary | ICD-10-CM

## 2015-10-28 DIAGNOSIS — L409 Psoriasis, unspecified: Secondary | ICD-10-CM | POA: Diagnosis not present

## 2015-10-28 LAB — COMPREHENSIVE METABOLIC PANEL
ALBUMIN: 4.4 g/dL (ref 3.5–5.2)
ALT: 22 U/L (ref 0–53)
AST: 28 U/L (ref 0–37)
Alkaline Phosphatase: 55 U/L (ref 39–117)
BUN: 14 mg/dL (ref 6–23)
CALCIUM: 9.5 mg/dL (ref 8.4–10.5)
CHLORIDE: 103 meq/L (ref 96–112)
CO2: 26 mEq/L (ref 19–32)
CREATININE: 1 mg/dL (ref 0.40–1.50)
GFR: 86.34 mL/min (ref >60.00)
Glucose, Bld: 99 mg/dL (ref 70–99)
POTASSIUM: 4 meq/L (ref 3.5–5.1)
Sodium: 138 mEq/L (ref 135–145)
Total Bilirubin: 0.6 mg/dL (ref 0.2–1.2)
Total Protein: 7.5 g/dL (ref 6.0–8.3)

## 2015-10-28 LAB — HEMOGLOBIN A1C: HEMOGLOBIN A1C: 5.4 % (ref 4.6–6.5)

## 2015-10-28 LAB — TSH: TSH: 1.23 u[IU]/mL (ref 0.35–4.50)

## 2015-10-28 NOTE — Telephone Encounter (Signed)
Please inform patient that his blood work is all within the normal limits for his thyroid function, kidney function, electrolytes, and A1c. His A1c is 5.4 which is normal with no evidence of diabetes. Therefore his headaches are most likely not related to blood sugar. Unfortunately I do not have a direct answer for his headaches, but if he continues to experience them we can consider imaging which would be the next step.

## 2015-10-28 NOTE — Assessment & Plan Note (Signed)
Stable with no complaints currently although significant areas noted on physical exam. Follow up if symptoms worsen or fail to improve.

## 2015-10-28 NOTE — Assessment & Plan Note (Signed)
Abdominal hernia with no evidence of strangulation, gangrene or pain. Continue to monitor at this time. Follow up if symptoms worsen or would like referral to general surgery.

## 2015-10-28 NOTE — Patient Instructions (Signed)
Thank you for choosing [facility].  Summary/Instructions:  Please stop by the lab on the basement level of the building for your blood work. Your results will be released to [facility] (or called to you) after review, usually within 72 hours after test completion. If any changes need to be made, you will be notified at that same time.  If your symptoms worsen or fail to improve, please contact our office for further instruction, or in case of emergency go directly to the emergency room at the closest medical facility.   General Headache Without Cause A headache is pain or discomfort felt around the head or neck area. The specific cause of a headache may not be found. There are many causes and types of headaches. A few common ones are:  Tension headaches.  Migraine headaches.  Cluster headaches.  Chronic daily headaches. HOME CARE INSTRUCTIONS  Watch your condition for any changes. Take these steps to help with your condition: Managing Pain  Take over-the-counter and prescription medicines only as told by your health care provider.  Lie down in a dark, quiet room when you have a headache.  If directed, apply ice to the head and neck area:  Put ice in a plastic bag.  Place a towel between your skin and the bag.  Leave the ice on for 20 minutes, 2-3 times per day.  Use a heating pad or hot shower to apply heat to the head and neck area as told by your health care provider.  Keep lights dim if bright lights bother you or make your headaches worse. Eating and Drinking  Eat meals on a regular schedule.  Limit alcohol use.  Decrease the amount of caffeine you drink, or stop drinking caffeine. General Instructions  Keep all follow-up visits as told by your health care provider. This is important.  Keep a headache journal to help find out what may trigger your headaches. For example, write down:  What you eat and drink.  How much sleep you get.  Any change to your  diet or medicines.  Try massage or other relaxation techniques.  Limit stress.  Sit up straight, and do not tense your muscles.  Do not use tobacco products, including cigarettes, chewing tobacco, or e-cigarettes. If you need help quitting, ask your health care provider.  Exercise regularly as told by your health care provider.  Sleep on a regular schedule. Get 7-9 hours of sleep, or the amount recommended by your health care provider. SEEK MEDICAL CARE IF:   Your symptoms are not helped by medicine.  You have a headache that is different from the usual headache.  You have nausea or you vomit.  You have a fever. SEEK IMMEDIATE MEDICAL CARE IF:   Your headache becomes severe.  You have repeated vomiting.  You have a stiff neck.  You have a loss of vision.  You have problems with speech.  You have pain in the eye or ear.  You have muscular weakness or loss of muscle control.  You lose your balance or have trouble walking.  You feel faint or pass out.  You have confusion.   This information is not intended to replace advice given to you by your health care provider. Make sure you discuss any questions you have with your health care provider.   Document Released: 12/06/2005 Document Revised: 08/27/2015 Document Reviewed: 03/31/2015 Elsevier Interactive Patient Education [employer].

## 2015-10-28 NOTE — Progress Notes (Signed)
Pre visit review using our clinic review tool, if applicable. No additional management support is needed unless otherwise documented below in the visit note. 

## 2015-10-28 NOTE — Progress Notes (Signed)
Subjective:    Patient ID: Zachary [name], male    DOB: [birth year], 44 y.o.   MRN: [id number]  Chief Complaint  Patient presents with  . Establish Care    having frequent headaches, he can wake up in the middle of the night with a headache, had a funny feeling a few weeks ago that caused him to have like a tingling all over his body, ate a candy bar and felt better, diabetes check    HPI:  Zachary [name] is a 44 y.o. male who  has a past medical history of Chicken pox and Frequent headaches. and presents today for an office visit to establish care.   1.) Headaches - Associated symptom of a headache has been going on for the past several months. Severity ranges from mild to severe. Described as located behind his eyes and has some throbbing. Denies sensitivity to light/sound or nausea or vomiting. Modifying factors include caffeine which does seem to help with the headache. Couple of weeks ago experienced some lightheadedness that was resolved with food. Does describe daytime sleepiess and fatigue. There is some feelings of rest when he sleeps. Epworth Sleepiness Scale of 13. Does have significant history of diabetes in the family.   No Known Allergies   Outpatient Prescriptions Prior to Visit  Medication Sig Dispense Refill  . benzonatate (TESSALON) 100 MG capsule Take 1 capsule (100 mg total) by mouth every 8 (eight) hours. 21 capsule 0  . clindamycin (CLEOCIN) 300 MG capsule Take 1 capsule (300 mg total) by mouth 4 (four) times daily. X 7 days 28 capsule 0  . gabapentin (NEURONTIN) 300 MG capsule Take 300 mg by mouth 3 (three) times daily.    Marland Kitchen HYDROcodone-acetaminophen (NORCO) 7.5-325 MG per tablet Take 1 tablet by mouth every 6 (six) hours as needed for moderate pain. 15 tablet 0  . hyoscyamine (LEVSIN) 0.125 MG tablet Take 1 tablet (0.125 mg total) by mouth every 4 (four) hours as needed for cramping. 30 tablet 0  . ibuprofen (ADVIL,MOTRIN) 800 MG tablet Take 1 tablet (800 mg total)  by mouth 3 (three) times daily. 21 tablet 0  . methocarbamol (ROBAXIN) 500 MG tablet Take 1 tablet (500 mg total) by mouth 2 (two) times daily as needed for muscle spasms. 20 tablet 0  . naproxen (NAPROSYN) 500 MG tablet Take 1 tablet (500 mg total) by mouth 2 (two) times daily with a meal. 20 tablet 0  . oseltamivir (TAMIFLU) 75 MG capsule Take 1 capsule (75 mg total) by mouth every 12 (twelve) hours. 10 capsule 0   No facility-administered medications prior to visit.     Past Medical History  Diagnosis Date  . Chicken pox   . Frequent headaches      Past Surgical History  Procedure Laterality Date  . Back surgery    . Appendectomy       Family History  Problem Relation Age of Onset  . Lupus Mother   . Prostate cancer Father   . Bladder Cancer Father   . Diabetes Father   . Colon cancer Maternal Grandmother   . Diabetes Maternal Grandmother      Social History   Social History  . Marital Status: Married    Spouse Name: N/A  . Number of Children: 3  . Years of Education: GED   Occupational History  . [employer]     Social History Main Topics  . Smoking status: Former Smoker -- 1.50 packs/day for 30 years  Types: Cigarettes    Quit date: 06/03/2011  . Smokeless tobacco: Never Used  . Alcohol Use: No  . Drug Use: Yes    Special: Marijuana  . Sexual Activity: Not on file   Other Topics Concern  . Not on file   Social History Narrative   Fun: Watch baseball and football, fish, golf, be with family   Denies religious beliefs effecting health care.     Review of Systems  Constitutional: Negative for fever, chills and unexpected weight change.  HENT: Negative for congestion and sinus pressure.   Skin: Positive for rash.  Neurological: Positive for headaches. Negative for dizziness, syncope, weakness, light-headedness and numbness.      Objective:    BP 118/78 mmHg  Pulse 61  Temp(Src) 97.9 F (36.6 C) (Oral)  Resp 18  Ht 6\' 1"  (1.854 m)  Wt 271 lb 6.4  oz (123.106 kg)  BMI 35.81 kg/m2  SpO2 94% Nursing note and vital signs reviewed.  Physical Exam  Constitutional: He is oriented to person, place, and time. He appears well-developed and well-nourished. No distress.  HENT:  Right Ear: Hearing, tympanic membrane, external ear and ear canal normal.  Left Ear: Hearing, tympanic membrane, external ear and ear canal normal.  Nose: Nose normal.  Mouth/Throat: Uvula is midline, oropharynx is clear and moist and mucous membranes are normal.  Eyes: Conjunctivae and EOM are normal. Pupils are equal, round, and reactive to light.  Cardiovascular: Normal rate, regular rhythm, normal heart sounds and intact distal pulses.   Pulmonary/Chest: Effort normal and breath sounds normal.  Abdominal: A hernia is present.    Neurological: He is alert and oriented to person, place, and time. No cranial nerve deficit.  Skin: Skin is warm and dry.  Areas of psoriasis located on bilateral knees and elbows that are grayish/white in color with an errythmatous base.   Psychiatric: He has a normal mood and affect. His behavior is normal. Judgment and thought content normal.       Assessment & Plan:   Problem List Items Addressed This Visit      Musculoskeletal and Integument   Psoriasis    Stable with no complaints currently although significant areas noted on physical exam. Follow up if symptoms worsen or fail to improve.         Other   Generalized headaches - Primary    Symptoms consistent with generalized headache of undetermined origin. Neuro and eye exam are benign. Encourage follow up eye exam with opthalmology. Epworth Sleepiness Scale of 13 consistent with potential for sleep apnea. Referral to pulmonology for further sleep work up. Obtain TSH, Hemoglobin A1c, and CMET to rule out metabolic causes. Continue treatment with OTC medications as needed for symptom relief. Follow up pending lab work and referrals, if headaches continue, consider imaging and  possible referral to neurology.       Relevant Orders   Hemoglobin A1c (Completed)   TSH (Completed)   Comprehensive metabolic panel (Completed)   Ambulatory referral to Pulmonology   Abdominal hernia    Abdominal hernia with no evidence of strangulation, gangrene or pain. Continue to monitor at this time. Follow up if symptoms worsen or would like referral to general surgery.        Other Visit Diagnoses    Encounter for immunization

## 2015-10-28 NOTE — Assessment & Plan Note (Signed)
Symptoms consistent with generalized headache of undetermined origin. Neuro and eye exam are benign. Encourage follow up eye exam with opthalmology. Epworth Sleepiness Scale of 13 consistent with potential for sleep apnea. Referral to pulmonology for further sleep work up. Obtain TSH, Hemoglobin A1c, and CMET to rule out metabolic causes. Continue treatment with OTC medications as needed for symptom relief. Follow up pending lab work and referrals, if headaches continue, consider imaging and possible referral to neurology.

## 2015-10-30 NOTE — Telephone Encounter (Signed)
Called pt and there was no answer and could not leave a VM bc the mailbox was too full . Will try back later.

## 2015-11-06 NOTE — Telephone Encounter (Signed)
Results have been sent in the mail 

## 2015-11-11 ENCOUNTER — Emergency Department (HOSPITAL_COMMUNITY): Payer: BLUE CROSS/BLUE SHIELD

## 2015-11-11 ENCOUNTER — Encounter (HOSPITAL_COMMUNITY): Payer: Self-pay | Admitting: Emergency Medicine

## 2015-11-11 ENCOUNTER — Emergency Department (HOSPITAL_COMMUNITY)
Admission: EM | Admit: 2015-11-11 | Discharge: 2015-11-11 | Disposition: A | Discharge status: Home / Self Care | Payer: BLUE CROSS/BLUE SHIELD | Attending: Emergency Medicine | Admitting: Emergency Medicine

## 2015-11-11 DIAGNOSIS — Y92410 Unspecified street and highway as the place of occurrence of the external cause: Secondary | ICD-10-CM | POA: Diagnosis not present

## 2015-11-11 DIAGNOSIS — S00211A Abrasion of right eyelid and periocular area, initial encounter: Secondary | ICD-10-CM | POA: Diagnosis not present

## 2015-11-11 DIAGNOSIS — Z23 Encounter for immunization: Secondary | ICD-10-CM | POA: Insufficient documentation

## 2015-11-11 DIAGNOSIS — Z8619 Personal history of other infectious and parasitic diseases: Secondary | ICD-10-CM | POA: Diagnosis not present

## 2015-11-11 DIAGNOSIS — T148 Other injury of unspecified body region: Secondary | ICD-10-CM | POA: Insufficient documentation

## 2015-11-11 DIAGNOSIS — Z87891 Personal history of nicotine dependence: Secondary | ICD-10-CM | POA: Diagnosis not present

## 2015-11-11 DIAGNOSIS — S199XXA Unspecified injury of neck, initial encounter: Secondary | ICD-10-CM | POA: Insufficient documentation

## 2015-11-11 DIAGNOSIS — S0993XA Unspecified injury of face, initial encounter: Secondary | ICD-10-CM | POA: Diagnosis present

## 2015-11-11 DIAGNOSIS — Y9389 Activity, other specified: Secondary | ICD-10-CM | POA: Insufficient documentation

## 2015-11-11 DIAGNOSIS — S3992XA Unspecified injury of lower back, initial encounter: Secondary | ICD-10-CM | POA: Insufficient documentation

## 2015-11-11 DIAGNOSIS — S0083XA Contusion of other part of head, initial encounter: Secondary | ICD-10-CM | POA: Diagnosis not present

## 2015-11-11 DIAGNOSIS — Y998 Other external cause status: Secondary | ICD-10-CM | POA: Diagnosis not present

## 2015-11-11 DIAGNOSIS — T07XXXA Unspecified multiple injuries, initial encounter: Secondary | ICD-10-CM

## 2015-11-11 MED ORDER — CYCLOBENZAPRINE HCL 10 MG PO TABS: 10.0000 mg | ORAL_TABLET | Freq: Three times a day (TID) | ORAL | Status: DC

## 2015-11-11 MED ORDER — TRAMADOL HCL 50 MG PO TABS: 50.0000 mg | ORAL_TABLET | Freq: Four times a day (QID) | ORAL | Status: DC | PRN

## 2015-11-11 MED ORDER — KETOROLAC TROMETHAMINE 10 MG PO TABS
10.0000 mg | ORAL_TABLET | Freq: Once | ORAL | Status: AC
  Administered 2015-11-11: 10 mg via ORAL
  Filled 2015-11-11: qty 1

## 2015-11-11 MED ORDER — TETANUS-DIPHTH-ACELL PERTUSSIS 5-2.5-18.5 LF-MCG/0.5 IM SUSP
0.5000 mL | Freq: Once | INTRAMUSCULAR | Status: AC
  Administered 2015-11-11: 0.5 mL via INTRAMUSCULAR
  Filled 2015-11-11: qty 0.5

## 2015-11-11 MED ORDER — MELOXICAM 15 MG PO TABS: 15.0000 mg | ORAL_TABLET | Freq: Every day | ORAL | Status: DC

## 2015-11-11 MED ORDER — DIAZEPAM 5 MG PO TABS
10.0000 mg | ORAL_TABLET | Freq: Once | ORAL | Status: AC
  Administered 2015-11-11: 10 mg via ORAL
  Filled 2015-11-11: qty 2
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
## 2015-11-11 NOTE — ED Notes (Signed)
Pt c/o pain, PA notified.
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
## 2015-11-19 ENCOUNTER — Ambulatory Visit (INDEPENDENT_AMBULATORY_CARE_PROVIDER_SITE_OTHER): Payer: BLUE CROSS/BLUE SHIELD | Admitting: Family

## 2015-11-19 ENCOUNTER — Telehealth: Payer: Self-pay | Admitting: Family

## 2015-11-19 ENCOUNTER — Other Ambulatory Visit (INDEPENDENT_AMBULATORY_CARE_PROVIDER_SITE_OTHER): Payer: BLUE CROSS/BLUE SHIELD

## 2015-11-19 ENCOUNTER — Encounter: Payer: Self-pay | Admitting: Family

## 2015-11-19 VITALS — BP 104/76 | HR 67 | Temp 97.6°F | Resp 18 | Ht 73.0 in | Wt 271.0 lb

## 2015-11-19 DIAGNOSIS — Z0000 Encounter for general adult medical examination without abnormal findings: Secondary | ICD-10-CM

## 2015-11-19 LAB — CBC
HCT: 44.9 % (ref 39.0–52.0)
HEMOGLOBIN: 15.3 g/dL (ref 13.0–17.0)
MCHC: 34.2 g/dL (ref 30.0–36.0)
MCV: 87.6 fl (ref 78.0–100.0)
PLATELETS: 250 10*3/uL (ref 150.0–400.0)
RBC: 5.12 Mil/uL (ref 4.22–5.81)
RDW: 12.7 % (ref 11.5–15.5)
WBC: 10.2 10*3/uL (ref 4.0–10.5)

## 2015-11-19 LAB — COMPREHENSIVE METABOLIC PANEL
ALBUMIN: 4 g/dL (ref 3.5–5.2)
ALT: 19 U/L (ref 0–53)
AST: 19 U/L (ref 0–37)
Alkaline Phosphatase: 58 U/L (ref 39–117)
BILIRUBIN TOTAL: 0.6 mg/dL (ref 0.2–1.2)
BUN: 15 mg/dL (ref 6–23)
CO2: 28 meq/L (ref 19–32)
Calcium: 9.3 mg/dL (ref 8.4–10.5)
Chloride: 104 mEq/L (ref 96–112)
Creatinine, Ser: 1.01 mg/dL (ref 0.40–1.50)
GFR: 85.33 mL/min (ref >60.00)
GLUCOSE: 88 mg/dL (ref 70–99)
POTASSIUM: 4.4 meq/L (ref 3.5–5.1)
SODIUM: 139 meq/L (ref 135–145)
TOTAL PROTEIN: 7 g/dL (ref 6.0–8.3)

## 2015-11-19 LAB — LIPID PANEL
CHOL/HDL RATIO: 7
Cholesterol: 203 mg/dL — ABNORMAL HIGH (ref 0–200)
HDL: 31 mg/dL — ABNORMAL LOW (ref >39.00)
LDL Cholesterol: 134 mg/dL — ABNORMAL HIGH (ref 0–99)
NONHDL: 171.89
Triglycerides: 187 mg/dL — ABNORMAL HIGH (ref 0.0–149.0)
VLDL: 37.4 mg/dL (ref 0.0–40.0)

## 2015-11-19 LAB — TSH: TSH: 1.23 u[IU]/mL (ref 0.35–4.50)

## 2015-11-19 LAB — PSA: PSA: 0.82 ng/mL (ref 0.10–4.00)

## 2015-11-19 MED ORDER — BETAMETHASONE DIPROPIONATE 0.05 % EX CREA: TOPICAL_CREAM | Freq: Two times a day (BID) | CUTANEOUS | Status: DC

## 2015-11-19 NOTE — Progress Notes (Signed)
Pre visit review using our clinic review tool, if applicable. No additional management support is needed unless otherwise documented below in the visit note. 

## 2015-11-19 NOTE — Assessment & Plan Note (Signed)
1) Anticipatory Guidance: Discussed importance of wearing a seatbelt while driving and not texting while driving; changing batteries in smoke detector at least once annually; wearing suntan lotion when outside; eating a balanced and moderate diet; getting physical activity at least 30 minutes per day.  2) Immunizations / Screenings / Labs:  All immunizations are up-to-date per recommendations. Due for dental screening which will be scheduled independently. At high risk for prostate cancer secondary to father-obtain PSA. All other screenings are up-to-date per recommendations. Obtain CBC, BMET, Lipid profile and TSH.   Overall well exam with risk factors for cardiovascular disease including obesity and sedentary lifestyle. Discussed importance of consuming a varied and moderate intake that is high in nutrient dense foods and low in saturated and transfer fats. Try to avoid processed foods. Increase physical activity to 30 minutes daily. Weight loss goal approximate 5-10% of current body weight in the next 6 months. Continue other healthy lifestyle behaviors and choices. Follow-up prevention exam in 1 year and follow-up office visit pending blood work is necessary.

## 2015-11-19 NOTE — Patient Instructions (Signed)
Thank you for choosing [facility].  Summary/Instructions:  Your prescription(s) have been submitted to your pharmacy or been printed and provided for you. Please take as directed and contact our office if you believe you are having problem(s) with the medication(s) or have any questions.  Please stop by the lab on the basement level of the building for your blood work. Your results will be released to [facility] (or called to you) after review, usually within 72 hours after test completion. If any changes need to be made, you will be notified at that same time.  If your symptoms worsen or fail to improve, please contact our office for further instruction, or in case of emergency go directly to the emergency room at the closest medical facility.    Health Maintenance, Male A healthy lifestyle and preventative care can promote health and wellness.  Maintain regular health, dental, and eye exams.  Eat a healthy diet. Foods like vegetables, fruits, whole grains, low-fat dairy products, and lean protein foods contain the nutrients you need and are low in calories. Decrease your intake of foods high in solid fats, added sugars, and salt. Get information about a proper diet from your health care provider, if necessary.  Regular physical exercise is one of the most important things you can do for your health. Most adults should get at least 150 minutes of moderate-intensity exercise (any activity that increases your heart rate and causes you to sweat) each week. In addition, most adults need muscle-strengthening exercises on 2 or more days a week.   Maintain a healthy weight. The body mass index (BMI) is a screening tool to identify possible weight problems. It provides an estimate of body fat based on height and weight. Your health care provider can find your BMI and can help you achieve or maintain a healthy weight. For males 20 years and older:  A BMI below 18.5 is considered underweight.  A  BMI of 18.5 to 24.9 is normal.  A BMI of 25 to 29.9 is considered overweight.  A BMI of 30 and above is considered obese.  Maintain normal blood lipids and cholesterol by exercising and minimizing your intake of saturated fat. Eat a balanced diet with plenty of fruits and vegetables. Blood tests for lipids and cholesterol should begin at age 2 and be repeated every 5 years. If your lipid or cholesterol levels are high, you are over age 70, or you are at high risk for heart disease, you may need your cholesterol levels checked more frequently.Ongoing high lipid and cholesterol levels should be treated with medicines if diet and exercise are not working.  If you smoke, find out from your health care provider how to quit. If you do not use tobacco, do not start.  Lung cancer screening is recommended for adults aged 64-80 years who are at high risk for developing lung cancer because of a history of smoking. A yearly low-dose CT scan of the lungs is recommended for people who have at least a 30-pack-year history of smoking and are current smokers or have quit within the past 15 years. A pack year of smoking is smoking an average of 1 pack of cigarettes a day for 1 year (for example, a 30-pack-year history of smoking could mean smoking 1 pack a day for 30 years or 2 packs a day for 15 years). Yearly screening should continue until the smoker has stopped smoking for at least 15 years. Yearly screening should be stopped for people who develop  a health problem that would prevent them from having lung cancer treatment.  If you choose to drink alcohol, do not have more than 2 drinks per day. One drink is considered to be 12 oz (360 mL) of beer, 5 oz (150 mL) of wine, or 1.5 oz (45 mL) of liquor.  Avoid the use of street drugs. Do not share needles with anyone. Ask for help if you need support or instructions about stopping the use of drugs.  High blood pressure causes heart disease and increases the risk of  stroke. High blood pressure is more likely to develop in:  People who have blood pressure in the end of the normal range (100-139/85-89 mm Hg).  People who are overweight or obese.  People who are African American.  If you are 15-33 years of age, have your blood pressure checked every 3-5 years. If you are 96 years of age or older, have your blood pressure checked every year. You should have your blood pressure measured twice--once when you are at a hospital or clinic, and once when you are not at a hospital or clinic. Record the average of the two measurements. To check your blood pressure when you are not at a hospital or clinic, you can use:  An automated blood pressure machine at a pharmacy.  A home blood pressure monitor.  If you are 24-65 years old, ask your health care provider if you should take aspirin to prevent heart disease.  Diabetes screening involves taking a blood sample to check your fasting blood sugar level. This should be done once every 3 years after age 76 if you are at a normal weight and without risk factors for diabetes. Testing should be considered at a younger age or be carried out more frequently if you are overweight and have at least 1 risk factor for diabetes.  Colorectal cancer can be detected and often prevented. Most routine colorectal cancer screening begins at the age of 55 and continues through age 44. However, your health care provider may recommend screening at an earlier age if you have risk factors for colon cancer. On a yearly basis, your health care provider may provide home test kits to check for hidden blood in the stool. A small camera at the end of a tube may be used to directly examine the colon (sigmoidoscopy or colonoscopy) to detect the earliest forms of colorectal cancer. Talk to your health care provider about this at age 70 when routine screening begins. A direct exam of the colon should be repeated every 5-10 years through age 60, unless early  forms of precancerous polyps or small growths are found.  People who are at an increased risk for hepatitis B should be screened for this virus. You are considered at high risk for hepatitis B if:  You were born in a country where hepatitis B occurs often. Talk with your health care provider about which countries are considered high risk.  Your parents were born in a high-risk country and you have not received a shot to protect against hepatitis B (hepatitis B vaccine).  You have HIV or AIDS.  You use needles to inject street drugs.  You live with, or have sex with, someone who has hepatitis B.  You are a man who has sex with other men (MSM).  You get hemodialysis treatment.  You take certain medicines for conditions like cancer, organ transplantation, and autoimmune conditions.  Hepatitis C blood testing is recommended for all people born from  1945 through 1965 and any individual with known risk factors for hepatitis C.  Healthy men should no longer receive prostate-specific antigen (PSA) blood tests as part of routine cancer screening. Talk to your health care provider about prostate cancer screening.  Testicular cancer screening is not recommended for adolescents or adult males who have no symptoms. Screening includes self-exam, a health care provider exam, and other screening tests. Consult with your health care provider about any symptoms you have or any concerns you have about testicular cancer.  Practice safe sex. Use condoms and avoid high-risk sexual practices to reduce the spread of sexually transmitted infections (STIs).  You should be screened for STIs, including gonorrhea and chlamydia if:  You are sexually active and are younger than 24 years.  You are older than 24 years, and your health care provider tells you that you are at risk for this type of infection.  Your sexual activity has changed since you were last screened, and you are at an increased risk for chlamydia  or gonorrhea. Ask your health care provider if you are at risk.  If you are at risk of being infected with HIV, it is recommended that you take a prescription medicine daily to prevent HIV infection. This is called pre-exposure prophylaxis (PrEP). You are considered at risk if:  You are a man who has sex with other men (MSM).  You are a heterosexual man who is sexually active with multiple partners.  You take drugs by injection.  You are sexually active with a partner who has HIV.  Talk with your health care provider about whether you are at high risk of being infected with HIV. If you choose to begin PrEP, you should first be tested for HIV. You should then be tested every 3 months for as long as you are taking PrEP.  Use sunscreen. Apply sunscreen liberally and repeatedly throughout the day. You should seek shade when your shadow is shorter than you. Protect yourself by wearing long sleeves, pants, a wide-brimmed hat, and sunglasses year round whenever you are outdoors.  Tell your health care provider of new moles or changes in moles, especially if there is a change in shape or color. Also, tell your health care provider if a mole is larger than the size of a pencil eraser.  A one-time screening for abdominal aortic aneurysm (AAA) and surgical repair of large AAAs by ultrasound is recommended for men aged 52-75 years who are current or former smokers.  Stay current with your vaccines (immunizations).   This information is not intended to replace advice given to you by your health care provider. Make sure you discuss any questions you have with your health care provider.   Document Released: 06/03/2008 Document Revised: 12/27/2014 Document Reviewed: 05/03/2011 Elsevier Interactive Patient Education [employer].

## 2015-11-19 NOTE — Progress Notes (Signed)
Subjective:    Patient ID: Zachary [name], male    DOB: 04-Jan-1971, 44 y.o.   MRN: [id number]  Chief Complaint  Patient presents with  . CPE    Fasting    HPI:  Zachary [name] is a 44 y.o. male who presents today for an annual wellness visit.   1) Health Maintenance -   Diet - Averages about 2 meals per day consisting of hot dogs, chilli, chicken, beef, pork, pizza; 2 servings of fruits/vegetables daily; 9-10 cups of caffeine per day  Exercise - No structured exercise   2) Preventative Exams / Immunizations:  Dental -- Due for exam  Vision -- Up to date   Health Maintenance  Topic Date Due  . HIV Screening  12/21/1986  . INFLUENZA VACCINE  07/20/2016  . TETANUS/TDAP  11/10/2025    Immunization History  Administered Date(s) Administered  . Influenza,inj,Quad PF,36+ Mos 10/28/2015  . Tdap 11/11/2015    No Known Allergies   Outpatient Prescriptions Prior to Visit  Medication Sig Dispense Refill  . acetaminophen (TYLENOL) 500 MG tablet Take 1,000 mg by mouth every 6 (six) hours as needed for headache.    . cyclobenzaprine (FLEXERIL) 10 MG tablet Take 1 tablet (10 mg total) by mouth 3 (three) times daily. 21 tablet 0  . meloxicam (MOBIC) 15 MG tablet Take 1 tablet (15 mg total) by mouth daily. 7 tablet 0  . traMADol (ULTRAM) 50 MG tablet Take 1 tablet (50 mg total) by mouth every 6 (six) hours as needed. 15 tablet 0   No facility-administered medications prior to visit.     Past Medical History  Diagnosis Date  . Chicken pox   . Frequent headaches      Past Surgical History  Procedure Laterality Date  . Back surgery    . Appendectomy       Family History  Problem Relation Age of Onset  . Lupus Mother   . Prostate cancer Father   . Bladder Cancer Father   . Diabetes Father   . Colon cancer Maternal Grandmother   . Diabetes Maternal Grandmother      Social History   Social History  . Marital Status: Married    Spouse Name: N/A  . Number  of Children: 3  . Years of Education: GED   Occupational History  . [employer]     Social History Main Topics  . Smoking status: Former Smoker -- 1.50 packs/day for 30 years    Types: Cigarettes    Quit date: 06/03/2011  . Smokeless tobacco: Never Used  . Alcohol Use: No  . Drug Use: No  . Sexual Activity: Not on file   Other Topics Concern  . Not on file   Social History Narrative   Fun: Watch baseball and football, fish, golf, be with family   Denies religious beliefs effecting health care.     Review of Systems  Constitutional: Denies fever, chills, fatigue, or significant weight gain/loss. HENT: Head: Denies headache or neck pain Ears: Denies changes in hearing, ringing in ears, earache, drainage Nose: Denies discharge, stuffiness, itching, nosebleed, sinus pain Throat: Denies sore throat, hoarseness, dry mouth, sores, thrush Eyes: Denies loss/changes in vision, pain, redness, blurry/double vision, flashing lights Cardiovascular: Denies chest pain/discomfort, tightness, palpitations, shortness of breath with activity, difficulty lying down, swelling, sudden awakening with shortness of breath Respiratory: Denies shortness of breath, cough, sputum production, wheezing Gastrointestinal: Denies dysphasia, heartburn, change in appetite, nausea, change in bowel habits, rectal bleeding,  constipation, diarrhea, yellow skin or eyes Genitourinary: Denies frequency, urgency, burning/pain, blood in urine, incontinence, change in urinary strength. Musculoskeletal: Denies muscle/joint pain, stiffness, back pain, redness or swelling of joints, trauma Skin: Denies rashes, lumps, itching, dryness, color changes, or hair/nail changes Neurological: Denies dizziness, fainting, seizures, weakness, numbness, tingling, tremor Psychiatric - Denies nervousness, stress, depression or memory loss Endocrine: Denies heat or cold intolerance, sweating, frequent urination, excessive thirst, changes in  appetite Hematologic: Denies ease of bruising or bleeding     Objective:     BP 104/76 mmHg  Pulse 67  Temp(Src) 97.6 F (36.4 C) (Oral)  Resp 18  Ht 6\' 1"  (1.854 m)  Wt 271 lb (122.925 kg)  BMI 35.76 kg/m2  SpO2 97% Nursing note and vital signs reviewed.  Physical Exam  Constitutional: He is oriented to person, place, and time. He appears well-developed and well-nourished.  HENT:  Head: Normocephalic.  Right Ear: Hearing, tympanic membrane, external ear and ear canal normal.  Left Ear: Hearing, tympanic membrane, external ear and ear canal normal.  Nose: Nose normal.  Mouth/Throat: Uvula is midline, oropharynx is clear and moist and mucous membranes are normal.  Eyes: Conjunctivae and EOM are normal. Pupils are equal, round, and reactive to light.  Neck: Neck supple. No JVD present. No tracheal deviation present. No thyromegaly present.  Cardiovascular: Normal rate, regular rhythm, normal heart sounds and intact distal pulses.   Pulmonary/Chest: Effort normal and breath sounds normal.  Abdominal: Soft. Bowel sounds are normal. He exhibits no distension and no mass. There is no tenderness. There is no rebound and no guarding.  Musculoskeletal: Normal range of motion. He exhibits no edema or tenderness.  Lymphadenopathy:    He has no cervical adenopathy.  Neurological: He is alert and oriented to person, place, and time. He has normal reflexes. No cranial nerve deficit. He exhibits normal muscle tone. Coordination normal.  Skin: Skin is warm and dry.  Psychiatric: He has a normal mood and affect. His behavior is normal. Judgment and thought content normal.       Assessment & Plan:   Problem List Items Addressed This Visit      Other   Routine general medical examination at a health care facility - Primary    1) Anticipatory Guidance: Discussed importance of wearing a seatbelt while driving and not texting while driving; changing batteries in smoke detector at least once  annually; wearing suntan lotion when outside; eating a balanced and moderate diet; getting physical activity at least 30 minutes per day.  2) Immunizations / Screenings / Labs:  All immunizations are up-to-date per recommendations. Due for dental screening which will be scheduled independently. At high risk for prostate cancer secondary to father-obtain PSA. All other screenings are up-to-date per recommendations. Obtain CBC, BMET, Lipid profile and TSH.   Overall well exam with risk factors for cardiovascular disease including obesity and sedentary lifestyle. Discussed importance of consuming a varied and moderate intake that is high in nutrient dense foods and low in saturated and transfer fats. Try to avoid processed foods. Increase physical activity to 30 minutes daily. Weight loss goal approximate 5-10% of current body weight in the next 6 months. Continue other healthy lifestyle behaviors and choices. Follow-up prevention exam in 1 year and follow-up office visit pending blood work is necessary.      Relevant Orders   TSH   PSA   Comprehensive metabolic panel   CBC   Lipid panel

## 2015-11-19 NOTE — Telephone Encounter (Signed)
Please inform patient that his blood work shows that his kidney function, liver function, thyroid function, electrolytes, prostate, and white/red blood cells are within the normal limits. His cholesterol shows that his LDL or bad cholesterol is elevated at 134 with a goal of less than 100. His HDL or good cholesterol was also low at 31 with a goal greater than 39 at minimum. This greatly increases the risk for cardiovascular disease. His triglycerides were also elevated at 187 with a goal less than 150. No medications currently required, however lifestyle changes to include daily aerobic exercise with the goal of 30 minutes and fine-tuning nutritional intake 20 moderate and varied diet that is focused on nutrient dense foods and low in saturated and transfer fats as well as processed foods. To increase his HDL through nutrition, it is recommended to intake fish oil, fish, I'll well, walnuts, flaxseed, or other sources omega-3 fatty acids. We can plan to follow-up in 6 months.

## 2015-11-20 NOTE — Telephone Encounter (Signed)
Pt aware of results 

## 2015-11-26 ENCOUNTER — Institutional Professional Consult (permissible substitution): Payer: BLUE CROSS/BLUE SHIELD | Admitting: Internal Medicine

## 2018-01-09 ENCOUNTER — Emergency Department (HOSPITAL_COMMUNITY): Payer: BLUE CROSS/BLUE SHIELD

## 2018-01-09 ENCOUNTER — Other Ambulatory Visit: Payer: Self-pay

## 2018-01-09 ENCOUNTER — Emergency Department (HOSPITAL_COMMUNITY)
Admission: EM | Admit: 2018-01-09 | Discharge: 2018-01-09 | Disposition: A | Discharge status: Home / Self Care | Payer: BLUE CROSS/BLUE SHIELD | Attending: Emergency Medicine | Admitting: Emergency Medicine

## 2018-01-09 ENCOUNTER — Encounter (HOSPITAL_COMMUNITY): Payer: Self-pay | Admitting: *Deleted

## 2018-01-09 DIAGNOSIS — Z87891 Personal history of nicotine dependence: Secondary | ICD-10-CM | POA: Diagnosis not present

## 2018-01-09 DIAGNOSIS — R55 Syncope and collapse: Secondary | ICD-10-CM | POA: Diagnosis not present

## 2018-01-09 DIAGNOSIS — F1290 Cannabis use, unspecified, uncomplicated: Secondary | ICD-10-CM | POA: Diagnosis not present

## 2018-01-09 DIAGNOSIS — R42 Dizziness and giddiness: Secondary | ICD-10-CM | POA: Diagnosis present

## 2018-01-09 LAB — CBC WITH DIFFERENTIAL/PLATELET
BASOS ABS: 0 10*3/uL (ref 0.0–0.1)
BASOS PCT: 0 %
EOS PCT: 2 %
Eosinophils Absolute: 0.2 10*3/uL (ref 0.0–0.7)
HCT: 41.7 % (ref 39.0–52.0)
Hemoglobin: 14.2 g/dL (ref 13.0–17.0)
LYMPHS PCT: 37 %
Lymphs Abs: 3.7 10*3/uL (ref 0.7–4.0)
MCH: 29.6 pg (ref 26.0–34.0)
MCHC: 34.1 g/dL (ref 30.0–36.0)
MCV: 87.1 fL (ref 78.0–100.0)
Monocytes Absolute: 0.7 10*3/uL (ref 0.1–1.0)
Monocytes Relative: 7 %
Neutro Abs: 5.5 10*3/uL (ref 1.7–7.7)
Neutrophils Relative %: 54 %
PLATELETS: 221 10*3/uL (ref 150–400)
RBC: 4.79 MIL/uL (ref 4.22–5.81)
RDW: 12.7 % (ref 11.5–15.5)
WBC: 10.1 10*3/uL (ref 4.0–10.5)

## 2018-01-09 LAB — COMPREHENSIVE METABOLIC PANEL
ALK PHOS: 47 U/L (ref 38–126)
ALT: 33 U/L (ref 17–63)
ANION GAP: 9 (ref 5–15)
AST: 59 U/L — ABNORMAL HIGH (ref 15–41)
Albumin: 3.9 g/dL (ref 3.5–5.0)
BILIRUBIN TOTAL: 0.6 mg/dL (ref 0.3–1.2)
BUN: 15 mg/dL (ref 6–20)
CALCIUM: 9 mg/dL (ref 8.9–10.3)
CO2: 27 mmol/L (ref 22–32)
Chloride: 104 mmol/L (ref 101–111)
Creatinine, Ser: 1.07 mg/dL (ref 0.61–1.24)
GFR calc Af Amer: >60 mL/min (ref >60)
Glucose, Bld: 100 mg/dL — ABNORMAL HIGH (ref 65–99)
POTASSIUM: 3.9 mmol/L (ref 3.5–5.1)
Sodium: 140 mmol/L (ref 135–145)
TOTAL PROTEIN: 7.2 g/dL (ref 6.5–8.1)

## 2018-01-09 LAB — TROPONIN I

## 2018-01-09 LAB — URINALYSIS, ROUTINE W REFLEX MICROSCOPIC
BILIRUBIN URINE: NEGATIVE
Bacteria, UA: NONE SEEN
GLUCOSE, UA: NEGATIVE mg/dL
Ketones, ur: NEGATIVE mg/dL
LEUKOCYTES UA: NEGATIVE
NITRITE: NEGATIVE
PH: 6 (ref 5.0–8.0)
Protein, ur: NEGATIVE mg/dL
SPECIFIC GRAVITY, URINE: 1.018 (ref 1.005–1.030)
Squamous Epithelial / LPF: NONE SEEN

## 2018-01-09 LAB — RAPID URINE DRUG SCREEN, HOSP PERFORMED
Amphetamines: NOT DETECTED
BARBITURATES: NOT DETECTED
Benzodiazepines: NOT DETECTED
Cocaine: NOT DETECTED
Opiates: NOT DETECTED
Tetrahydrocannabinol: POSITIVE — AB

## 2018-01-09 MED ORDER — SODIUM CHLORIDE 0.9 % IV BOLUS (SEPSIS)
1000.0000 mL | Freq: Once | INTRAVENOUS | Status: AC
  Administered 2018-01-09: 1000 mL via INTRAVENOUS

## 2018-01-09 NOTE — ED Triage Notes (Signed)
C/o episodes of dizziness intermittent over the past 2 weeks, states he passed out yesterday while getting a tattoo, states he feels drained

## 2018-01-09 NOTE — ED Provider Notes (Signed)
[facility] EMERGENCY DEPARTMENT Provider Note   CSN: [id number] Arrival date & time: 01/09/18  1201     History   Chief Complaint Chief Complaint  Patient presents with  . Dizziness    HPI Zachary [name] is a 47 y.o. male.  HPI Pt was seen at 1325. Per pt, c/o gradual onset and persistence of multiple intermittent episodes of "lightheadedness" that began 2 weeks ago. Worse over the past 3 days. Pt states he "feels drained." Pt states yesterday he was getting a tattoo and "passed out." Pt also states he had N/V yesterday.  Denies vertigo, no palpitations, no SOB/cough, no calf/LE pain or unilateral swelling, no abd pain, no further N/V, no diarrhea, no black or blood in stools or emesis, no neck or back pain, no focal motor weakness, no tingling/numbness in extremities.    Past Medical History:  Diagnosis Date  . Chicken pox   . Frequent headaches     Patient Active Problem List   Diagnosis Date Noted  . Routine general medical examination at a health care facility 11/19/2015  . Psoriasis 10/28/2015  . Generalized headaches 10/28/2015  . Abdominal hernia 10/28/2015  . Left leg weakness 11/29/2011  . Stiffness of joints, not elsewhere classified, multiple sites 11/29/2011    Past Surgical History:  Procedure Laterality Date  . APPENDECTOMY    . BACK SURGERY         Home Medications    Prior to Admission medications   Medication Sig Start Date End Date Taking? Authorizing Provider  ibuprofen (ADVIL,MOTRIN) 200 MG tablet Take 600 mg by mouth every 6 (six) hours as needed.   Yes [provider]  betamethasone dipropionate (DIPROLENE) 0.05 % cream Apply topically 2 (two) times daily. Patient not taking: Reported on 01/09/2018 11/19/15   Golden Circle, FNP    Family History Family History  Problem Relation Age of Onset  . Lupus Mother   . Prostate cancer Father   . Bladder Cancer Father   . Diabetes Father   . Colon cancer Maternal Grandmother   .  Diabetes Maternal Grandmother     Social History Social History   Tobacco Use  . Smoking status: Former Smoker    Packs/day: 1.50    Years: 30.00    Pack years: 45.00    Types: Cigarettes    Last attempt to quit: 06/03/2011    Years since quitting: 6.6  . Smokeless tobacco: Never Used  Substance Use Topics  . Alcohol use: No  . Drug use: No     Allergies   Patient has no known allergies.   Review of Systems Review of Systems ROS: Statement: All systems negative except as marked or noted in the HPI; Constitutional: Negative for fever and chills. ; ; Eyes: Negative for eye pain, redness and discharge. ; ; ENMT: Negative for ear pain, hoarseness, nasal congestion, sinus pressure and sore throat. ; ; Cardiovascular: Negative for chest pain, palpitations, diaphoresis, dyspnea and peripheral edema. ; ; Respiratory: Negative for cough, wheezing and stridor. ; ; Gastrointestinal: Negative for nausea, vomiting, diarrhea, abdominal pain, blood in stool, hematemesis, jaundice and rectal bleeding. . ; ; Genitourinary: Negative for dysuria, flank pain and hematuria. ; ; Musculoskeletal: Negative for back pain and neck pain. Negative for swelling and trauma.; ; Skin: Negative for pruritus, rash, abrasions, blisters, bruising and skin lesion.; ; Neuro: Negative for headache and neck stiffness. Negative for weakness, extremity weakness, paresthesias, involuntary movement, seizure and +lightheadedness, syncope.  Physical Exam Updated Vital Signs BP 120/84   Pulse 68   Temp 97.9 F (36.6 C) (Oral)   Resp (!) 8   Ht 6\' 1"  (1.854 m)   Wt 131.5 kg (290 lb)   SpO2 100%   BMI 38.26 kg/m    13:21:37 Orthostatic Vital Signs CW  Orthostatic Lying   BP- Lying: 126/85  Pulse- Lying: 63      Orthostatic Sitting  BP- Sitting: 130/75  Pulse- Sitting: 69      Orthostatic Standing at 0 minutes  BP- Standing at 0 minutes:  115/92  Pulse- Standing at 0 minutes: 76    Physical  Exam 1330: Physical examination:  Nursing notes reviewed; Vital signs and O2 SAT reviewed;  Constitutional: Well developed, Well nourished, Well hydrated, In no acute distress; Head:  Normocephalic, atraumatic; Eyes: EOMI, PERRL, No scleral icterus; ENMT: TM's clear bilat. +edemetous nasal turbinates bilat with clear rhinorrhea. Mouth and pharynx normal, Mucous membranes moist; Neck: Supple, Full range of motion, No lymphadenopathy; Cardiovascular: Regular rate and rhythm, No gallop; Respiratory: Breath sounds clear & equal bilaterally, No wheezes.  Speaking full sentences with ease, Normal respiratory effort/excursion; Chest: Nontender, Movement normal; Abdomen: Soft, Nontender, Nondistended, Normal bowel sounds; Genitourinary: No CVA tenderness; Extremities: Pulses normal, No tenderness, No edema, No calf edema or asymmetry.; Neuro: AA&Ox3, Major CN grossly intact. No facial droop. No nystagmus.  Speech clear. No gross focal motor or sensory deficits in extremities. Climbs on and off stretcher easily by himself. Gait steady.; Skin: Color normal, Warm, Dry.   ED Treatments / Results  Labs (all labs ordered are listed, but only abnormal results are displayed)   EKG  EKG Interpretation  Date/Time:  Monday January 09 2018 13:15:46 EST Ventricular Rate:  65 PR Interval:    QRS Duration: 92 QT Interval:  403 QTC Calculation: 419 R Axis:   57 Text Interpretation:  Sinus rhythm Abnormal R-wave progression, early transition Baseline wander When compared with ECG of 06/02/2011 No significant change was found Confirmed by Francine Graven [phone]) on 01/09/2018 1:54:59 PM       Radiology   Procedures Procedures (including critical care time)  Medications Ordered in ED Medications  sodium chloride 0.9 % bolus 1,000 mL (1,000 mLs Intravenous New Bag/Given 01/09/18 1345)     Initial Impression / Assessment and Plan / ED Course  I have reviewed the triage vital signs and the nursing  notes.  Pertinent labs & imaging results that were available during my care of the patient were reviewed by me and considered in my medical decision making (see chart for details).  MDM Reviewed: previous chart, nursing note and vitals Reviewed previous: labs and ECG Interpretation: labs, ECG, x-ray and CT scan    Results for orders placed or performed during the hospital encounter of 01/09/18  Urine rapid drug screen (hosp performed)  Result Value Ref Range   Opiates NONE DETECTED NONE DETECTED   Cocaine NONE DETECTED NONE DETECTED   Benzodiazepines NONE DETECTED NONE DETECTED   Amphetamines NONE DETECTED NONE DETECTED   Tetrahydrocannabinol POSITIVE (A) NONE DETECTED   Barbiturates NONE DETECTED NONE DETECTED  Urinalysis, Routine w reflex microscopic  Result Value Ref Range   Color, Urine YELLOW YELLOW   APPearance CLEAR CLEAR   Specific Gravity, Urine 1.018 1.005 - 1.030   pH 6.0 5.0 - 8.0   Glucose, UA NEGATIVE NEGATIVE mg/dL   Hgb urine dipstick SMALL (A) NEGATIVE   Bilirubin Urine NEGATIVE NEGATIVE   Ketones, ur NEGATIVE  NEGATIVE mg/dL   Protein, ur NEGATIVE NEGATIVE mg/dL   Nitrite NEGATIVE NEGATIVE   Leukocytes, UA NEGATIVE NEGATIVE   RBC / HPF 0-5 0 - 5 RBC/hpf   WBC, UA 0-5 0 - 5 WBC/hpf   Bacteria, UA NONE SEEN NONE SEEN   Squamous Epithelial / LPF NONE SEEN NONE SEEN   Mucus PRESENT   Comprehensive metabolic panel  Result Value Ref Range   Sodium 140 135 - 145 mmol/L   Potassium 3.9 3.5 - 5.1 mmol/L   Chloride 104 101 - 111 mmol/L   CO2 27 22 - 32 mmol/L   Glucose, Bld 100 (H) 65 - 99 mg/dL   BUN 15 6 - 20 mg/dL   Creatinine, Ser 1.07 0.61 - 1.24 mg/dL   Calcium 9.0 8.9 - 10.3 mg/dL   Total Protein 7.2 6.5 - 8.1 g/dL   Albumin 3.9 3.5 - 5.0 g/dL   AST 59 (H) 15 - 41 U/L   ALT 33 17 - 63 U/L   Alkaline Phosphatase 47 38 - 126 U/L   Total Bilirubin 0.6 0.3 - 1.2 mg/dL   GFR calc non Af Amer >60 >60 mL/min   GFR calc Af Amer >60 >60 mL/min   Anion gap  9 5 - 15  CBC with Differential  Result Value Ref Range   WBC 10.1 4.0 - 10.5 K/uL   RBC 4.79 4.22 - 5.81 MIL/uL   Hemoglobin 14.2 13.0 - 17.0 g/dL   HCT 41.7 39.0 - 52.0 %   MCV 87.1 78.0 - 100.0 fL   MCH 29.6 26.0 - 34.0 pg   MCHC 34.1 30.0 - 36.0 g/dL   RDW 12.7 11.5 - 15.5 %   Platelets 221 150 - 400 K/uL   Neutrophils Relative % 54 %   Neutro Abs 5.5 1.7 - 7.7 K/uL   Lymphocytes Relative 37 %   Lymphs Abs 3.7 0.7 - 4.0 K/uL   Monocytes Relative 7 %   Monocytes Absolute 0.7 0.1 - 1.0 K/uL   Eosinophils Relative 2 %   Eosinophils Absolute 0.2 0.0 - 0.7 K/uL   Basophils Relative 0 %   Basophils Absolute 0.0 0.0 - 0.1 K/uL  Troponin I  Result Value Ref Range   Troponin I <0.03 <0.03 ng/mL   Dg Chest 2 View Result Date: 01/09/2018 CLINICAL DATA:  Syncope, lightheadedness EXAM: CHEST  2 VIEW COMPARISON:  12/16/2012 FINDINGS: The heart size and mediastinal contours are within normal limits. Both lungs are clear. The visualized skeletal structures are unremarkable. IMPRESSION: No active cardiopulmonary disease. Electronically Signed   By: Kathreen Devoid   On: 01/09/2018 14:32   Ct Head Wo Contrast Result Date: 01/09/2018 CLINICAL DATA:  Intermittent dizziness for 2 weeks EXAM: CT HEAD WITHOUT CONTRAST TECHNIQUE: Contiguous axial images were obtained from the base of the skull through the vertex without intravenous contrast. COMPARISON:  11/11/2015 FINDINGS: Brain: No evidence of acute infarction, hemorrhage, hydrocephalus, extra-axial collection or mass lesion/mass effect. Vascular: No hyperdense vessel or unexpected calcification. Skull: No osseous abnormality. Sinuses/Orbits: Visualized paranasal sinuses are clear. Visualized mastoid sinuses are clear. Visualized orbits demonstrate no focal abnormality. Other: None IMPRESSION: No acute intracranial pathology. Electronically Signed   By: Kathreen Devoid   On: 01/09/2018 14:46   US Abdomen Limited Result Date: 01/09/2018 CLINICAL DATA:   Elevated liver function test EXAM: ULTRASOUND ABDOMEN LIMITED RIGHT UPPER QUADRANT COMPARISON:  None. FINDINGS: Gallbladder: No gallstones or wall thickening visualized. No sonographic Murphy sign noted by sonographer. Common bile  duct: Diameter: 3.9 mm Liver: No focal lesion identified. Mild diffuse increased echotexture of liver noted. Portal vein is patent on color Doppler imaging with normal direction of blood flow towards the liver. IMPRESSION: Mild fatty infiltration of liver. Electronically Signed   By: Abelardo Diesel M.D.   On: 01/09/2018 15:48    1610:   AST mildly elevated, [place] with fatty liver.  Workup otherwise reassuring. Pt has tol PO well while in the ED without N/V.  No stooling while in the ED.  Abd remains benign, VSS. Feels better after IVF bolus and wants to go home now. Tx symptomatically at this time. Dx and testing d/w pt.  Questions answered.  Verb understanding, agreeable to d/c home with outpt f/u.     Final Clinical Impressions(s) / ED Diagnoses   Final diagnoses:  None    ED Discharge Orders    None       Francine Graven, DO 01/11/18 1514

## 2018-01-09 NOTE — Discharge Instructions (Signed)
Take your usual prescriptions as previously directed. One of your liver function tests was elevated today and the ultrasound showed a "fatty liver." You will need to have this test rechecked by your regular medical doctor. Avoid tylenol, tylenol-containing products, and alcohol until you are seen in follow up.  Increase your fluid intake for the next several days.  Call your regular medical doctor today to schedule a follow up appointment within the next 3 days.  Return to the Emergency Department immediately sooner if worsening.

## 2019-12-25 ENCOUNTER — Encounter (HOSPITAL_COMMUNITY): Payer: Self-pay

## 2019-12-25 ENCOUNTER — Emergency Department (HOSPITAL_COMMUNITY): Payer: Self-pay

## 2019-12-25 ENCOUNTER — Other Ambulatory Visit: Payer: Self-pay

## 2019-12-25 DIAGNOSIS — Z87891 Personal history of nicotine dependence: Secondary | ICD-10-CM | POA: Insufficient documentation

## 2019-12-25 DIAGNOSIS — S51832A Puncture wound without foreign body of left forearm, initial encounter: Secondary | ICD-10-CM | POA: Insufficient documentation

## 2019-12-25 DIAGNOSIS — W540XXA Bitten by dog, initial encounter: Secondary | ICD-10-CM | POA: Insufficient documentation

## 2019-12-25 DIAGNOSIS — Y9389 Activity, other specified: Secondary | ICD-10-CM | POA: Insufficient documentation

## 2019-12-25 DIAGNOSIS — Z23 Encounter for immunization: Secondary | ICD-10-CM | POA: Insufficient documentation

## 2019-12-25 DIAGNOSIS — Y999 Unspecified external cause status: Secondary | ICD-10-CM | POA: Insufficient documentation

## 2019-12-25 DIAGNOSIS — Y929 Unspecified place or not applicable: Secondary | ICD-10-CM | POA: Insufficient documentation

## 2019-12-25 DIAGNOSIS — Z79899 Other long term (current) drug therapy: Secondary | ICD-10-CM | POA: Insufficient documentation

## 2019-12-25 MED ORDER — ACETAMINOPHEN 325 MG PO TABS
650.0000 mg | ORAL_TABLET | Freq: Once | ORAL | Status: AC
  Administered 2019-12-25: 650 mg via ORAL
  Filled 2019-12-25: qty 2

## 2019-12-25 NOTE — ED Triage Notes (Signed)
Pt states he woke up to his dogs fighting and tried to break them up and one of bit his left forearm. Said his dog isn't UTD on rabies shot.

## 2019-12-26 ENCOUNTER — Emergency Department (HOSPITAL_COMMUNITY)
Admission: EM | Admit: 2019-12-26 | Discharge: 2019-12-26 | Disposition: A | Discharge status: Home / Self Care | Payer: Self-pay | Attending: Emergency Medicine | Admitting: Emergency Medicine

## 2019-12-26 DIAGNOSIS — W540XXA Bitten by dog, initial encounter: Secondary | ICD-10-CM

## 2019-12-26 MED ORDER — TETANUS-DIPHTH-ACELL PERTUSSIS 5-2.5-18.5 LF-MCG/0.5 IM SUSP
0.5000 mL | Freq: Once | INTRAMUSCULAR | Status: AC
  Administered 2019-12-26: 0.5 mL via INTRAMUSCULAR
  Filled 2019-12-26: qty 0.5

## 2019-12-26 MED ORDER — AMPICILLIN-SULBACTAM SODIUM 3 (2-1) G IV SOLR: 3.0000 g | Freq: Once | INTRAVENOUS | Status: DC

## 2019-12-26 MED ORDER — HYDROCODONE-ACETAMINOPHEN 5-325 MG PO TABS: 1.0000 | ORAL_TABLET | Freq: Four times a day (QID) | ORAL | 0 refills | Status: DC | PRN

## 2019-12-26 MED ORDER — SODIUM CHLORIDE 0.9 % IV SOLN
3.0000 g | Freq: Once | INTRAVENOUS | Status: AC
  Administered 2019-12-26: 3 g via INTRAVENOUS
  Filled 2019-12-26: qty 8

## 2019-12-26 MED ORDER — AMOXICILLIN-POT CLAVULANATE 500-125 MG PO TABS: 1.0000 | ORAL_TABLET | Freq: Three times a day (TID) | ORAL | 0 refills | Status: DC

## 2019-12-26 MED ORDER — HYDROCODONE-ACETAMINOPHEN 5-325 MG PO TABS
2.0000 | ORAL_TABLET | Freq: Once | ORAL | Status: AC
  Administered 2019-12-26: 03:00:00 2 via ORAL
  Filled 2019-12-26: qty 2

## 2019-12-26 NOTE — Discharge Instructions (Addendum)
Begin taking Augmentin as prescribed and hydrocodone as prescribed as needed for pain.  Dressing changes twice daily.  Return to the emergency department for increased redness up or down the arm, purulent drainage from the wound, high fever, or other new and concerning symptoms.

## 2019-12-26 NOTE — ED Provider Notes (Signed)
[facility] EMERGENCY DEPARTMENT Provider Note   CSN: [id number] Arrival date & time: 12/25/19  2156     History Chief Complaint  Patient presents with  . Animal Bite    Dog    Zachary [name] is a 49 y.o. male.  Patient is a 48 year old male with no significant past medical history.  He presents today for evaluation of a dog bite.  Patient states that his 2 dogs were fighting.  When he attempted to break up the fight, one of them bit him on his left forearm.  Patient is uncertain about tetanus and tells me the dog's shots are not up-to-date.  The history is provided by the patient.  Animal Bite Contact animal:  Dog Animal bite location: Left forearm. Pain details:    Quality:  Aching   Severity:  Moderate   Timing:  Constant   Progression:  Worsening Incident location:  Home Provoked: unprovoked   Animal in possession: yes   Tetanus status:  Out of date Relieved by:  Nothing Worsened by:  Activity      Past Medical History:  Diagnosis Date  . Chicken pox   . Frequent headaches     Patient Active Problem List   Diagnosis Date Noted  . Routine general medical examination at a health care facility 11/19/2015  . Psoriasis 10/28/2015  . Generalized headaches 10/28/2015  . Abdominal hernia 10/28/2015  . Left leg weakness 11/29/2011  . Stiffness of joints, not elsewhere classified, multiple sites 11/29/2011    Past Surgical History:  Procedure Laterality Date  . APPENDECTOMY    . BACK SURGERY         Family History  Problem Relation Age of Onset  . Lupus Mother   . Prostate cancer Father   . Bladder Cancer Father   . Diabetes Father   . Colon cancer Maternal Grandmother   . Diabetes Maternal Grandmother     Social History   Tobacco Use  . Smoking status: Former Smoker    Packs/day: 1.50    Years: 30.00    Pack years: 45.00    Types: Cigarettes    Quit date: 06/03/2011    Years since quitting: 8.5  . Smokeless tobacco: Never Used  Substance Use  Topics  . Alcohol use: No  . Drug use: No    Home Medications Prior to Admission medications   Medication Sig Start Date End Date Taking? Authorizing Provider  betamethasone dipropionate (DIPROLENE) 0.05 % cream Apply topically 2 (two) times daily. 11/19/15   Golden Circle, FNP  ibuprofen (ADVIL,MOTRIN) 200 MG tablet Take 600 mg by mouth every 6 (six) hours as needed.    [provider]    Allergies    Patient has no known allergies.  Review of Systems   Review of Systems  All other systems reviewed and are negative.   Physical Exam Updated Vital Signs BP (!) 95/54 (BP Location: Right Arm)   Pulse 64   Temp 97.9 F (36.6 C) (Oral)   Resp 20   Ht 6\' 1"  (1.854 m)   Wt 127 kg   SpO2 100%   BMI 36.94 kg/m   Physical Exam Vitals and nursing note reviewed.  Constitutional:      General: He is not in acute distress.    Appearance: Normal appearance. He is not ill-appearing.  HENT:     Head: Normocephalic and atraumatic.  Pulmonary:     Effort: Pulmonary effort is normal.  Musculoskeletal:  Comments: There are multiple puncture wounds to the left forearm.  Bleeding is controlled.  Patient does have pain with range of motion, but is able to flex and extend all of his fingers and wrist.  Ulnar and radial pulses are palpable and capillary refill is brisk to all fingers.  Skin:    General: Skin is warm and dry.  Neurological:     Mental Status: He is alert and oriented to person, place, and time.     ED Results / Procedures / Treatments   Labs (all labs ordered are listed, but only abnormal results are displayed) Labs Reviewed - No data to display  EKG None  Radiology DG Forearm Left  Result Date: 12/25/2019 CLINICAL DATA:  Pain status post dog bite. EXAM: LEFT FOREARM - 2 VIEW COMPARISON:  None. FINDINGS: There is subcutaneous gas and soft tissue swelling involving the distal forearm without evidence for radiopaque foreign body or displaced fracture.  IMPRESSION: Soft tissue swelling and subcutaneous gas without evidence for radiopaque foreign body or displaced fracture. Electronically Signed   By: Constance Holster M.D.   On: 12/25/2019 22:57    Procedures Procedures (including critical care time)  Medications Ordered in ED Medications  ampicillin-sulbactam (UNASYN) injection 3 g (has no administration in time range)  Tdap (BOOSTRIX) injection 0.5 mL (has no administration in time range)  acetaminophen (TYLENOL) tablet 650 mg (650 mg Oral Given 12/25/19 2211)    ED Course  I have reviewed the triage vital signs and the nursing notes.  Pertinent labs & imaging results that were available during my care of the patient were reviewed by me and considered in my medical decision making (see chart for details).    MDM Rules/Calculators/A&P  Patient is a 49 year old male presenting with a dog bite to the left arm.  Patient's tetanus updated and IV Unasyn administered.  The wound was copiously irrigated with normal saline, then a dressing was placed with bacitracin.  Patient to repeat dressing changes at home and will be prescribed Augmentin.  Patient's tetanus shot was updated.  The dog's shots are out of date as well.  Patient advised to quarantine the dog's for 10 days and return if the dog becomes sick and dyes or escapes.  Final Clinical Impression(s) / ED Diagnoses Final diagnoses:  None    Rx / DC Orders ED Discharge Orders    None       Veryl Speak, MD 12/26/19 [phone]

## 2020-04-22 ENCOUNTER — Other Ambulatory Visit: Payer: Self-pay

## 2020-04-22 ENCOUNTER — Ambulatory Visit: Payer: Self-pay | Attending: Internal Medicine

## 2020-04-22 DIAGNOSIS — Z20822 Contact with and (suspected) exposure to covid-19: Secondary | ICD-10-CM | POA: Insufficient documentation

## 2020-04-23 LAB — SARS-COV-2, NAA 2 DAY TAT

## 2020-04-23 LAB — NOVEL CORONAVIRUS, NAA: SARS-CoV-2, NAA: NOT DETECTED

## 2020-05-28 ENCOUNTER — Observation Stay (HOSPITAL_COMMUNITY)
Admission: EM | Admit: 2020-05-28 | Discharge: 2020-05-29 | Disposition: A | Discharge status: Home / Self Care | Payer: Self-pay | Attending: Internal Medicine | Admitting: Internal Medicine

## 2020-05-28 ENCOUNTER — Observation Stay (HOSPITAL_COMMUNITY): Payer: Self-pay

## 2020-05-28 ENCOUNTER — Other Ambulatory Visit: Payer: Self-pay

## 2020-05-28 ENCOUNTER — Encounter (HOSPITAL_COMMUNITY): Payer: Self-pay | Admitting: *Deleted

## 2020-05-28 ENCOUNTER — Emergency Department (HOSPITAL_COMMUNITY): Payer: Self-pay

## 2020-05-28 DIAGNOSIS — G43809 Other migraine, not intractable, without status migrainosus: Secondary | ICD-10-CM | POA: Insufficient documentation

## 2020-05-28 DIAGNOSIS — Z87891 Personal history of nicotine dependence: Secondary | ICD-10-CM | POA: Insufficient documentation

## 2020-05-28 DIAGNOSIS — G459 Transient cerebral ischemic attack, unspecified: Principal | ICD-10-CM | POA: Insufficient documentation

## 2020-05-28 DIAGNOSIS — Z20822 Contact with and (suspected) exposure to covid-19: Secondary | ICD-10-CM | POA: Insufficient documentation

## 2020-05-28 DIAGNOSIS — R29810 Facial weakness: Secondary | ICD-10-CM

## 2020-05-28 DIAGNOSIS — R42 Dizziness and giddiness: Secondary | ICD-10-CM

## 2020-05-28 HISTORY — DX: Psoriasis, unspecified: L40.9

## 2020-05-28 LAB — COMPREHENSIVE METABOLIC PANEL
ALT: 27 U/L (ref 0–44)
AST: 33 U/L (ref 15–41)
Albumin: 4.1 g/dL (ref 3.5–5.0)
Alkaline Phosphatase: 55 U/L (ref 38–126)
Anion gap: 7 (ref 5–15)
BUN: 14 mg/dL (ref 6–20)
CO2: 26 mmol/L (ref 22–32)
Calcium: 8.8 mg/dL — ABNORMAL LOW (ref 8.9–10.3)
Chloride: 105 mmol/L (ref 98–111)
Creatinine, Ser: 1.36 mg/dL — ABNORMAL HIGH (ref 0.61–1.24)
GFR calc Af Amer: >60 mL/min (ref >60)
GFR calc non Af Amer: >60 mL/min (ref >60)
Glucose, Bld: 97 mg/dL (ref 70–99)
Potassium: 5.4 mmol/L — ABNORMAL HIGH (ref 3.5–5.1)
Sodium: 138 mmol/L (ref 135–145)
Total Bilirubin: 1.4 mg/dL — ABNORMAL HIGH (ref 0.3–1.2)
Total Protein: 7.4 g/dL (ref 6.5–8.1)

## 2020-05-28 LAB — CBC
HCT: 44.3 % (ref 39.0–52.0)
Hemoglobin: 15.3 g/dL (ref 13.0–17.0)
MCH: 30.7 pg (ref 26.0–34.0)
MCHC: 34.5 g/dL (ref 30.0–36.0)
MCV: 88.8 fL (ref 80.0–100.0)
Platelets: 245 10*3/uL (ref 150–400)
RBC: 4.99 MIL/uL (ref 4.22–5.81)
RDW: 12 % (ref 11.5–15.5)
WBC: 11.1 10*3/uL — ABNORMAL HIGH (ref 4.0–10.5)
nRBC: 0 % (ref 0.0–0.2)

## 2020-05-28 LAB — I-STAT CHEM 8, ED
BUN: 18 mg/dL (ref 6–20)
Calcium, Ion: 1.13 mmol/L — ABNORMAL LOW (ref 1.15–1.40)
Chloride: 103 mmol/L (ref 98–111)
Creatinine, Ser: 1.4 mg/dL — ABNORMAL HIGH (ref 0.61–1.24)
Glucose, Bld: 93 mg/dL (ref 70–99)
HCT: 45 % (ref 39.0–52.0)
Hemoglobin: 15.3 g/dL (ref 13.0–17.0)
Potassium: 5.5 mmol/L — ABNORMAL HIGH (ref 3.5–5.1)
Sodium: 139 mmol/L (ref 135–145)
TCO2: 29 mmol/L (ref 22–32)

## 2020-05-28 LAB — DIFFERENTIAL
Abs Immature Granulocytes: 0.04 10*3/uL (ref 0.00–0.07)
Basophils Absolute: 0.1 10*3/uL (ref 0.0–0.1)
Basophils Relative: 1 %
Eosinophils Absolute: 0.2 10*3/uL (ref 0.0–0.5)
Eosinophils Relative: 2 %
Immature Granulocytes: 0 %
Lymphocytes Relative: 37 %
Lymphs Abs: 4.1 10*3/uL — ABNORMAL HIGH (ref 0.7–4.0)
Monocytes Absolute: 0.7 10*3/uL (ref 0.1–1.0)
Monocytes Relative: 7 %
Neutro Abs: 5.9 10*3/uL (ref 1.7–7.7)
Neutrophils Relative %: 53 %

## 2020-05-28 LAB — RESPIRATORY PANEL BY RT PCR (FLU A&B, COVID)
Influenza A by PCR: NEGATIVE
Influenza B by PCR: NEGATIVE
SARS Coronavirus 2 by RT PCR: NEGATIVE

## 2020-05-28 LAB — PROTIME-INR
INR: 1 (ref 0.8–1.2)
Prothrombin Time: 12.7 seconds (ref 11.4–15.2)

## 2020-05-28 LAB — CBG MONITORING, ED: Glucose-Capillary: 94 mg/dL (ref 70–99)

## 2020-05-28 LAB — APTT: aPTT: 32 seconds (ref 24–36)

## 2020-05-28 MED ORDER — STROKE: EARLY STAGES OF RECOVERY BOOK: Freq: Once | Status: AC

## 2020-05-28 MED ORDER — ACETAMINOPHEN 650 MG RE SUPP: 650.0000 mg | RECTAL | Status: DC | PRN

## 2020-05-28 MED ORDER — SENNOSIDES-DOCUSATE SODIUM 8.6-50 MG PO TABS: 1.0000 | ORAL_TABLET | Freq: Every evening | ORAL | Status: DC | PRN

## 2020-05-28 MED ORDER — ACETAMINOPHEN 325 MG PO TABS: 650.0000 mg | ORAL_TABLET | ORAL | Status: DC | PRN

## 2020-05-28 MED ORDER — ACETAMINOPHEN 160 MG/5ML PO SOLN: 650.0000 mg | ORAL | Status: DC | PRN

## 2020-05-28 MED ORDER — ASPIRIN 81 MG PO CHEW
324.0000 mg | CHEWABLE_TABLET | Freq: Once | ORAL | Status: AC
  Administered 2020-05-28: 324 mg via ORAL
  Filled 2020-05-28: qty 4

## 2020-05-28 MED ORDER — SODIUM CHLORIDE 0.9 % IV SOLN: INTRAVENOUS | Status: AC

## 2020-05-28 MED ORDER — SODIUM CHLORIDE 0.9% FLUSH
3.0000 mL | Freq: Once | INTRAVENOUS | Status: AC
  Administered 2020-05-28: 3 mL via INTRAVENOUS

## 2020-05-28 NOTE — ED Triage Notes (Signed)
Pt reports at 1200 today the right side of his face "started to feel funny". Pt reports some of the feeling has gone away but part of his lip and upper face still "numb". Pt denies any numbness in right arm or leg. LKW 1200.   Dr. Sabra Heck notified of possible stroke at 1351.

## 2020-05-28 NOTE — H&P (Signed)
History and Physical:    Zachary [name]   OZD:[id number] DOB: March 10, 1971 DOA: 05/28/2020  Referring MD/provider: Dr. Sabra Heck PCP: Patient, No Pcp Per   Patient coming from: Home  Chief Complaint: Right facial numbness  History of Present Illness:   Zachary [name] is an 49 y.o. male with PMH significant for chronic headaches which have never been worked up, on no chronic medications who does smoke 2 marijuana blunts a day for the past 15-20 years was in his usual state of health until earlier this afternoon when he developed right facial numbness while playing with his pet lizard.  Patient states that he developed a "weird numb feeling" on the right side of his face which lasted about 15 minutes and then resolved with residual numbness near his lips and forehead area.  He notes that after the numbness resolved he developed a headache on the right side with radiation behind his right ear.  Patient subsequently notes that he felt dizzy and "swimmy headed" so decided to come to the ED at his wife's urging.  Patient notes that by the time he came to the ED most of his symptoms had resolved although he did have some residual tingling sensation at his right lip and by his right eye.  Dizziness had resolved.  Patient notes his headaches are normally bilateral frontal headaches and it is rare for him to have unisided headaches although he has had those before.  He does not recall ever having symptoms like this before.  Denies any history of known migraines although states he does not have a PCP and has not seen a doctor for many years.  Patient denies fevers chills or malaise.  No cough no shortness of breath.  No palpitations or chest pain.  No photophobia.  No recent injuries.  ED Course:  The patient was called in as a code stroke.  Head CT was negative.  He was seen by neurology who noted this might be a atypical migraine but would still recommend admission for TIA work-up.  Patient was started on  aspirin 325 mg per their recommendations.  ROS:   ROS   Review of Systems: As per HPI  Past Medical History:   Past Medical History:  Diagnosis Date  . Chicken pox   . Frequent headaches     Past Surgical History:   Past Surgical History:  Procedure Laterality Date  . APPENDECTOMY    . BACK SURGERY    . FOOT SURGERY      Social History:   Social History   Socioeconomic History  . Marital status: Married    Spouse name: Not on file  . Number of children: 3  . Years of education: GED  . Highest education level: Not on file  Occupational History  . Occupation: [employer]   Tobacco Use  . Smoking status: Former Smoker    Packs/day: 1.50    Years: 30.00    Pack years: 45.00    Types: Cigarettes    Quit date: 06/03/2011    Years since quitting: 8.9  . Smokeless tobacco: Never Used  Substance and Sexual Activity  . Alcohol use: No  . Drug use: Yes    Types: Marijuana    Comment: last used today 05/28/20  . Sexual activity: Not on file  Other Topics Concern  . Not on file  Social History Narrative   Fun: Watch baseball and football, fish, golf, be with family   Denies religious beliefs effecting  health care.    Social Determinants of Health   Financial Resource Strain:   . Difficulty of Paying Living Expenses:   Food Insecurity:   . Worried About Charity fundraiser in the Last Year:   . Arboriculturist in the Last Year:   Transportation Needs:   . Film/video editor (Medical):   Marland Kitchen Lack of Transportation (Non-Medical):   Physical Activity:   . Days of Exercise per Week:   . Minutes of Exercise per Session:   Stress:   . Feeling of Stress :   Social Connections:   . Frequency of Communication with Friends and Family:   . Frequency of Social Gatherings with Friends and Family:   . Attends Religious Services:   . Active Member of Clubs or Organizations:   . Attends Archivist Meetings:   Marland Kitchen Marital Status:   Intimate Partner Violence:   . Fear  of Current or Ex-Partner:   . Emotionally Abused:   Marland Kitchen Physically Abused:   . Sexually Abused:     Allergies   Patient has no known allergies.  Family history:   Family History  Problem Relation Age of Onset  . Lupus Mother   . Prostate cancer Father   . Bladder Cancer Father   . Diabetes Father   . Colon cancer Maternal Grandmother   . Diabetes Maternal Grandmother     Current Medications:   Prior to Admission medications   Not on File    Physical Exam:   Vitals:   05/28/20 1430 05/28/20 1445 05/28/20 1500 05/28/20 1615  BP: 120/88 116/75 117/88 132/88  Pulse: (!) 56 (!) 56 (!) 53 70  Resp: 14 11 (!) 7   Temp:      TempSrc:      SpO2: 96% 98% 98% 98%  Weight:      Height:         Physical Exam: Blood pressure 132/88, pulse 70, temperature 98.2 F (36.8 C), temperature source Oral, resp. rate (!) 7, height 6' (1.829 m), weight 123.6 kg, SpO2 98 %. Gen: Well-appearing man lying flat in bed in no acute distress.  Patient is able to speak in full sentences without difficulty, no problems with comprehension. Eyes: sclera anicteric, conjuctiva mildly injected bilaterally CVS: S1-S2, regulary, no gallops Respiratory:  decreased air entry likely secondary to decreased inspiratory effort GI: NABS, soft, NT patient does have what appears to be a hernia in his left lower quadrant which is not reducible.  Patient states he has had that for many years but has never been evaluated. LE: No edema. No cyanosis Neuro: A/O x 3, Moving all extremities equally with normal strength--more complete neuro exam per telemetry neurology note. Psych: patient is logical and coherent, judgement and insight appear normal, mood and affect appropriate to situation.   Data Review:    Labs: Basic Metabolic Panel: Recent Labs  Lab 05/28/20 1410 05/28/20 1418  NA 138 139  K 5.4* 5.5*  CL 105 103  CO2 26  --   GLUCOSE 97 93  BUN 14 18  CREATININE 1.36* 1.40*  CALCIUM 8.8*  --     Liver Function Tests: Recent Labs  Lab 05/28/20 1410  AST 33  ALT 27  ALKPHOS 55  BILITOT 1.4*  PROT 7.4  ALBUMIN 4.1   No results for input(s): LIPASE, AMYLASE in the last 168 hours. No results for input(s): AMMONIA in the last 168 hours. CBC: Recent Labs  Lab 05/28/20 1410  05/28/20 1418  WBC 11.1*  --   NEUTROABS 5.9  --   HGB 15.3 15.3  HCT 44.3 45.0  MCV 88.8  --   PLT 245  --    Cardiac Enzymes: No results for input(s): CKTOTAL, CKMB, CKMBINDEX, TROPONINI in the last 168 hours.  BNP (last 3 results) No results for input(s): PROBNP in the last 8760 hours. CBG: Recent Labs  Lab 05/28/20 1408  GLUCAP 94    Urinalysis    Component Value Date/Time   COLORURINE YELLOW 01/09/2018 1334   APPEARANCEUR CLEAR 01/09/2018 1334   LABSPEC 1.018 01/09/2018 1334   PHURINE 6.0 01/09/2018 1334   GLUCOSEU NEGATIVE 01/09/2018 1334   HGBUR SMALL (A) 01/09/2018 1334   BILIRUBINUR NEGATIVE 01/09/2018 1334   KETONESUR NEGATIVE 01/09/2018 1334   PROTEINUR NEGATIVE 01/09/2018 1334   UROBILINOGEN 1.0 06/02/2011 0956   NITRITE NEGATIVE 01/09/2018 1334   LEUKOCYTESUR NEGATIVE 01/09/2018 1334      Radiographic Studies: MR BRAIN WO CONTRAST  Result Date: 05/28/2020 CLINICAL DATA:  Neuro deficit, acute, stroke suspected. EXAM: MRI HEAD WITHOUT CONTRAST TECHNIQUE: Multiplanar, multiecho pulse sequences of the brain and surrounding structures were obtained without intravenous contrast. COMPARISON:  Head CT May 28, 2020 FINDINGS: The study is partially degraded by motion. Brain: No acute infarction, hemorrhage, hydrocephalus, extra-axial collection or mass lesion. Vascular: Normal flow voids. Skull and upper cervical spine: No focal lesion. Sinuses/Orbits: Negative. Other: None. IMPRESSION: 1. Motion degraded study. 2. No evidence of acute intracranial abnormality. Electronically Signed   By: Pedro Earls M.D.   On: 05/28/2020 15:48   CT HEAD CODE STROKE WO  CONTRAST  Result Date: 05/28/2020 CLINICAL DATA:  Code stroke.  Right facial numbness. EXAM: CT HEAD WITHOUT CONTRAST TECHNIQUE: Contiguous axial images were obtained from the base of the skull through the vertex without intravenous contrast. COMPARISON:  01/09/2018 FINDINGS: Brain: There is no evidence of acute infarct, intracranial hemorrhage, mass, midline shift, or extra-axial fluid collection. Asymmetry of the lateral ventricles is unchanged and likely developmental. Vascular: No hyperdense vessel. Skull: No fracture or suspicious osseous lesion. Sinuses/Orbits: Visualized paranasal sinuses and mastoid air cells are clear. Orbits are unremarkable. Other: None. ASPECTS [facility] Stroke Program Early CT Score) - Ganglionic level infarction (caudate, lentiform nuclei, internal capsule, insula, M1-M3 cortex): 7 - Supraganglionic infarction (M4-M6 cortex): 3 Total score (0-10 with 10 being normal): 10 IMPRESSION: Negative head CT.  ASPECTS is 10. These results were called by telephone at the time of interpretation on 05/28/2020 at 2:15 pm to Dr. Noemi Chapel , who verbally acknowledged these results. Electronically Signed   By: Logan Bores M.D.   On: 05/28/2020 14:15    EKG: Independently reviewed.  Sinus rhythm at 60.  Normal intervals.  Normal axis.  Normal EKG.  No acute ST-T wave changes.   Assessment/Plan:   Principal Problem:   TIA (transient ischemic attack) Active Problems:   Migraine variant  Facial numbness TIA versus atypical migraine, likely to be the latter given no risk factors for atherosclerotic disease other than chronic marijuana use. However given telemetry neurology recommendations, will admit for completion of TIA work-up. MRI already done, negative for stroke. Carotid Dopplers, echocardiogram ordered Speech therapy consult ordered Neurology consult placed. Patient received aspirin 325 mg x 1 in the ED. I have not continued aspirin pending neurology consult as I do not have a  high suspicion for cerebrovascular disease in this patient.  Renal dysfunction Patient with creatinine 1.4, last known creatinine was 1.1  in 2019. Patient does not have a PCP, would benefit from referral to PCP upon discharge to follow UA ordered with microscopy but I suspect this is more likely progressive chronic disease.  Stable ventral hernia. Nonreducible but not incarcerated Patient should have follow-up with general surgery as an outpatient given high risk of incarceration with a nonreducible hernia.    Other information:   DVT prophylaxis: None ordered given outpatient status completely ambulatory. Code Status: Full Family Communication: Patient states he is in contact with his wife. Disposition Plan: Home Consults called: Neurology for the morning Admission status: Observation  [facility]  If 7PM-7AM, please contact night-coverage [email or web address] Password [email or web address] 05/28/2020, 4:50 PM

## 2020-05-28 NOTE — ED Provider Notes (Signed)
[facility] EMERGENCY DEPARTMENT Provider Note   CSN: [id number] Arrival date & time: 05/28/20  1344  An emergency department physician performed an initial assessment on this suspected stroke patient at 1352.  History Chief Complaint  Patient presents with  . Numbness    Zachary [name] is a 49 y.o. male.  HPI   This patient is a 49 year old male, evidently the patient has a history of frequent headaches, does not take any specific daily medications, presents to the hospital today with a complaint of right-sided facial droop which she stated started about 1 hour ago, he states his face felt like he had a shot of Novocain, felt like he was drooping on that side, it is gradually improving but still present.  Denies any arm or leg symptoms.  Denies any difficulty with speech.  Symptoms are persistent, gradually improving, has never had anything like this before.  No headache at this time  Past Medical History:  Diagnosis Date  . Chicken pox   . Frequent headaches     Patient Active Problem List   Diagnosis Date Noted  . Routine general medical examination at a health care facility 11/19/2015  . Psoriasis 10/28/2015  . Generalized headaches 10/28/2015  . Abdominal hernia 10/28/2015  . Left leg weakness 11/29/2011  . Stiffness of joints, not elsewhere classified, multiple sites 11/29/2011    Past Surgical History:  Procedure Laterality Date  . APPENDECTOMY    . BACK SURGERY    . FOOT SURGERY         Family History  Problem Relation Age of Onset  . Lupus Mother   . Prostate cancer Father   . Bladder Cancer Father   . Diabetes Father   . Colon cancer Maternal Grandmother   . Diabetes Maternal Grandmother     Social History   Tobacco Use  . Smoking status: Former Smoker    Packs/day: 1.50    Years: 30.00    Pack years: 45.00    Types: Cigarettes    Quit date: 06/03/2011    Years since quitting: 8.9  . Smokeless tobacco: Never Used  Substance Use Topics  .  Alcohol use: No  . Drug use: Yes    Types: Marijuana    Comment: last used today 05/28/20    Home Medications Prior to Admission medications   Medication Sig Start Date End Date Taking? Authorizing Provider  amoxicillin-clavulanate (AUGMENTIN) 500-125 MG tablet Take 1 tablet (500 mg total) by mouth every 8 (eight) hours. 12/26/19   Veryl Speak, MD  betamethasone dipropionate (DIPROLENE) 0.05 % cream Apply topically 2 (two) times daily. 11/19/15   Golden Circle, FNP  HYDROcodone-acetaminophen (NORCO) 5-325 MG tablet Take 1-2 tablets by mouth every 6 (six) hours as needed. 12/26/19   Veryl Speak, MD  ibuprofen (ADVIL,MOTRIN) 200 MG tablet Take 600 mg by mouth every 6 (six) hours as needed.    [provider]    Allergies    Patient has no known allergies.  Review of Systems   Review of Systems  All other systems reviewed and are negative.   Physical Exam Updated Vital Signs BP 116/75   Pulse (!) 56   Temp 98.2 F (36.8 C) (Oral)   Resp 11   Ht 1.829 m (6')   Wt 123.6 kg   SpO2 98%   BMI 36.96 kg/m   Physical Exam Vitals and nursing note reviewed.  Constitutional:      General: He is not in acute distress.  Appearance: He is well-developed.  HENT:     Head: Normocephalic and atraumatic.     Mouth/Throat:     Pharynx: No oropharyngeal exudate.  Eyes:     General: No scleral icterus.       Right eye: No discharge.        Left eye: No discharge.     Conjunctiva/sclera: Conjunctivae normal.     Pupils: Pupils are equal, round, and reactive to light.  Neck:     Thyroid: No thyromegaly.     Vascular: No JVD.  Cardiovascular:     Rate and Rhythm: Normal rate and regular rhythm.     Heart sounds: Normal heart sounds. No murmur. No friction rub. No gallop.   Pulmonary:     Effort: Pulmonary effort is normal. No respiratory distress.     Breath sounds: Normal breath sounds. No wheezing or rales.  Abdominal:     General: Bowel sounds are normal. There is no  distension.     Palpations: Abdomen is soft. There is no mass.     Tenderness: There is no abdominal tenderness.  Musculoskeletal:        General: No tenderness. Normal range of motion.     Cervical back: Normal range of motion and neck supple.  Lymphadenopathy:     Cervical: No cervical adenopathy.  Skin:    General: Skin is warm and dry.     Findings: No erythema or rash.  Neurological:     Mental Status: He is alert.     Coordination: Coordination normal.     Comments: Subtle right-sided facial droop, there is a question about whether he is able to raise the right side of his forehead.  His speech is normal, cranial nerves III through XII are otherwise normal, he has decreased sensation of the right side of the face that being said.  All 4 extremities with normal strength, normal finger-nose-finger, normal grips, normal strength in his legs as well.  Normal sensation of the arms and the legs.  Memory is intact, speech is clear, peripheral visual fields are normal.  Psychiatric:        Behavior: Behavior normal.     ED Results / Procedures / Treatments   Labs (all labs ordered are listed, but only abnormal results are displayed) Labs Reviewed  CBC - Abnormal; Notable for the following components:      Result Value   WBC 11.1 (*)    All other components within normal limits  DIFFERENTIAL - Abnormal; Notable for the following components:   Lymphs Abs 4.1 (*)    All other components within normal limits  COMPREHENSIVE METABOLIC PANEL - Abnormal; Notable for the following components:   Potassium 5.4 (*)    Creatinine, Ser 1.36 (*)    Calcium 8.8 (*)    Total Bilirubin 1.4 (*)    All other components within normal limits  I-STAT CHEM 8, ED - Abnormal; Notable for the following components:   Potassium 5.5 (*)    Creatinine, Ser 1.40 (*)    Calcium, Ion 1.13 (*)    All other components within normal limits  PROTIME-INR  APTT  CBG MONITORING, ED    EKG EKG  Interpretation  Date/Time:  Wednesday May 28 2020 14:11:57 EDT Ventricular Rate:  64 PR Interval:    QRS Duration: 91 QT Interval:  394 QTC Calculation: 407 R Axis:   44 Text Interpretation: Sinus rhythm Abnormal R-wave progression, early transition since last tracing no significant change Confirmed  by Noemi Chapel [id number]) on 05/28/2020 2:45:23 PM   Radiology CT HEAD CODE STROKE WO CONTRAST  Result Date: 05/28/2020 CLINICAL DATA:  Code stroke.  Right facial numbness. EXAM: CT HEAD WITHOUT CONTRAST TECHNIQUE: Contiguous axial images were obtained from the base of the skull through the vertex without intravenous contrast. COMPARISON:  01/09/2018 FINDINGS: Brain: There is no evidence of acute infarct, intracranial hemorrhage, mass, midline shift, or extra-axial fluid collection. Asymmetry of the lateral ventricles is unchanged and likely developmental. Vascular: No hyperdense vessel. Skull: No fracture or suspicious osseous lesion. Sinuses/Orbits: Visualized paranasal sinuses and mastoid air cells are clear. Orbits are unremarkable. Other: None. ASPECTS [facility] Stroke Program Early CT Score) - Ganglionic level infarction (caudate, lentiform nuclei, internal capsule, insula, M1-M3 cortex): 7 - Supraganglionic infarction (M4-M6 cortex): 3 Total score (0-10 with 10 being normal): 10 IMPRESSION: Negative head CT.  ASPECTS is 10. These results were called by telephone at the time of interpretation on 05/28/2020 at 2:15 pm to Dr. Noemi Chapel , who verbally acknowledged these results. Electronically Signed   By: Logan Bores M.D.   On: 05/28/2020 14:15    Procedures Procedures (including critical care time)  Medications Ordered in ED Medications  sodium chloride flush (NS) 0.9 % injection 3 mL (3 mLs Intravenous Given by Other 05/28/20 1413)  aspirin chewable tablet 324 mg (324 mg Oral Given 05/28/20 1449)    ED Course  I have reviewed the triage vital signs and the nursing notes.  Pertinent labs &  imaging results that were available during my care of the patient were reviewed by me and considered in my medical decision making (see chart for details).    MDM Rules/Calculators/A&P                      This patient symptoms could be consistent with either a Bell's palsy or stroke, it is difficult to tell and it seems to be improving.  We will activate a code stroke.  The patient was seen at approximately 145 on arrival in a wheelchair in the hallway on his way back to her room.  I activated a code stroke and he went to CT scan instead.  I have viewed the CT scan and I agree with the radiologist that there is no acute findings including hemorrhage or acute stroke.  Metabolic panel reveals mild hyperkalemia at 5.5 with a creatinine of 1.4.  CBC shows no significant findings  I have viewed and interpreted the EKG, there is no acute findings.  No signs of ischemia or arrhythmia.  I discussed the care with the teleneurologist after a code stroke was activated immediately on the patient's arrival.  They agree that the patient needs a full dose aspirin and the rest of the stroke work-up including an MRI.  Though this could be a stroke mimic given the patient's history of headache, he is not actively having a headache and will need to have further work-up.  MRI has been ordered.  We will consult with the hospitalist for admission  D/w Dr. Harlen Labs - will admit  Final Clinical Impression(s) / ED Diagnoses Final diagnoses:  Facial droop    Rx / DC Orders ED Discharge Orders    None       Noemi Chapel, MD 05/28/20 1527

## 2020-05-28 NOTE — Consult Note (Signed)
[facility] Consult Services   Date of Service:   05/28/2020 14:06:26  Impression:     .  R20.2 - Paresthesia of skin     .  I63.9 - Cerebrovascular accident (CVA), unspecified mechanism ([place])  Comments/Sign-Out: 49 yo M with sudden onset of right lower facial numbness, associated right post auricular HA and lightheaded sensation. Symptoms are still not fully better but improving, non disabling. Stroke vs. migraine w brainstem aura  Metrics: Last Known Well: 05/28/2020 12:00:00 [facility] Notification Time: 05/28/2020 14:06:26 Arrival Time: 05/28/2020 13:54:00 Stamp Time: 05/28/2020 14:06:26 Time First Login Attempt: 05/28/2020 14:10:17 Symptoms: right lower face numbness NIHSS Start Assessment Time: 05/28/2020 14:15:40 Patient is not a candidate for Alteplase/Activase. Alteplase Medical Decision: 05/28/2020 14:23:28 Patient was not deemed candidate for Alteplase/Activase thrombolytics because of following reasons: No disabling symptoms.  CT head showed no acute hemorrhage or acute core infarct.  ED Physician notified of diagnostic impression and management plan on 05/28/2020 14:31:03  Advanced Imaging: Advanced Imaging Not Recommended because:  Clinical Presentation is not Suggestive of LVO and NIHSS is <6   Our recommendations are outlined below.  Recommendations:     .  Activate Stroke Protocol Admission/Order Set     .  Stroke/Telemetry Floor     .  Neuro Checks     .  Bedside Swallow Eval     .  DVT Prophylaxis     .  IV Fluids, Normal Saline     .  Head of Bed 30 Degrees     .  Euglycemia and Avoid Hyperthermia (PRN Acetaminophen)      .  Antiplatelet Therapy Recommended     .  full dose aspirin x 1     .  tPA not indicated given mild non disabling symptoms; risk of hemorrhage outweighs any long term benefit     .  Due to sudden onset, multiple associated symptoms and lack of cortical spreading stroke is favored. Admit  for MRI/MRA and workup  Routine Consultation with [facility] for Follow up Care  Sign Out:     .  Discussed with Emergency Department Provider    ------------------------------------------------------------------------------  History of Present Illness: Patient is a 49 year old Male.  Patient was brought by private transportation with symptoms of right lower face numbness  who has right facial numbness since noon while sitting at the edge of his bed. He felt dizzy and lightheaded very quickly. He felt like he was in a daze. He also felt some headache behind his right ear. He typically has headaches but does not have these symptoms Denies any medical problems. Smokes marijuana  Last seen normal was within 4.5 hours. There is no history of hemorrhagic complications or intracranial hemorrhage. There is no history of Recent Anticoagulants. There is no history of recent major surgery. There is no history of recent stroke.   Antiplatelet use: No    Examination: BP(114/89), Pulse(68), Blood Glucose(94) 1A: Level of Consciousness - Alert; keenly responsive + 0 1B: Ask Month and Age - Both Questions Right + 0 1C: Blink Eyes & Squeeze Hands - Performs Both Tasks + 0 2: Test Horizontal Extraocular Movements - Normal + 0 3: Test Visual Fields - No Visual Loss + 0 4: Test Facial Palsy (Use Grimace if Obtunded) - Normal symmetry + 0 5A: Test Left Arm Motor Drift - No Drift for 10 Seconds + 0 5B: Test Right Arm Motor Drift - No Drift for 10 Seconds + 0 6A: Test  Left Leg Motor Drift - No Drift for 5 Seconds + 0 6B: Test Right Leg Motor Drift - No Drift for 5 Seconds + 0 7: Test Limb Ataxia (FNF/Heel-Shin) - No Ataxia + 0 8: Test Sensation - Mild-Moderate Loss: Less Sharp/More Dull + 1 9: Test Language/Aphasia - Normal; No aphasia + 0 10: Test Dysarthria - Normal + 0 11: Test Extinction/Inattention - No abnormality + 0  NIHSS Score: 1  Pre-Morbid Modified Ranking Scale: 0  Points = No symptoms at all   Patient/Family was informed the Neurology Consult would occur via TeleHealth consult by way of interactive audio and video telecommunications and consented to receiving care in this manner.   Patient is being evaluated for possible acute neurologic impairment and high probability of imminent or life-threatening deterioration. I spent total of 35 minutes providing care to this patient, including time for face to face visit via telemedicine, review of medical records, imaging studies and discussion of findings with providers, the patient and/or family.   Dr Leda Quail   [facility] [phone]  Case [id number]

## 2020-05-28 NOTE — Progress Notes (Signed)
CODE STROKE CT TIMES 1400 EXAM STARTED 1401 EXAM ENDED 1401 IMAGES SENT TO SOC 1406 EXAM COMPLETED [place]

## 2020-05-29 ENCOUNTER — Observation Stay (HOSPITAL_BASED_OUTPATIENT_CLINIC_OR_DEPARTMENT_OTHER): Payer: Self-pay

## 2020-05-29 ENCOUNTER — Observation Stay (HOSPITAL_COMMUNITY): Payer: Self-pay

## 2020-05-29 DIAGNOSIS — G459 Transient cerebral ischemic attack, unspecified: Secondary | ICD-10-CM

## 2020-05-29 LAB — CBC
HCT: 46.3 % (ref 39.0–52.0)
Hemoglobin: 15.8 g/dL (ref 13.0–17.0)
MCH: 30 pg (ref 26.0–34.0)
MCHC: 34.1 g/dL (ref 30.0–36.0)
MCV: 88 fL (ref 80.0–100.0)
Platelets: 250 10*3/uL (ref 150–400)
RBC: 5.26 MIL/uL (ref 4.22–5.81)
RDW: 11.8 % (ref 11.5–15.5)
WBC: 8.6 10*3/uL (ref 4.0–10.5)
nRBC: 0 % (ref 0.0–0.2)

## 2020-05-29 LAB — BASIC METABOLIC PANEL
Anion gap: 9 (ref 5–15)
BUN: 12 mg/dL (ref 6–20)
CO2: 26 mmol/L (ref 22–32)
Calcium: 8.8 mg/dL — ABNORMAL LOW (ref 8.9–10.3)
Chloride: 103 mmol/L (ref 98–111)
Creatinine, Ser: 1.13 mg/dL (ref 0.61–1.24)
GFR calc Af Amer: >60 mL/min (ref >60)
GFR calc non Af Amer: >60 mL/min (ref >60)
Glucose, Bld: 100 mg/dL — ABNORMAL HIGH (ref 70–99)
Potassium: 4.1 mmol/L (ref 3.5–5.1)
Sodium: 138 mmol/L (ref 135–145)

## 2020-05-29 LAB — LIPID PANEL
Cholesterol: 194 mg/dL (ref 0–200)
HDL: 27 mg/dL — ABNORMAL LOW (ref >40)
LDL Cholesterol: 125 mg/dL — ABNORMAL HIGH (ref 0–99)
Total CHOL/HDL Ratio: 7.2 RATIO
Triglycerides: 211 mg/dL — ABNORMAL HIGH (ref <150)
VLDL: 42 mg/dL — ABNORMAL HIGH (ref 0–40)

## 2020-05-29 LAB — ECHOCARDIOGRAM COMPLETE
Height: 72 in
Weight: 4360 oz

## 2020-05-29 LAB — HEMOGLOBIN A1C
Hgb A1c MFr Bld: 5.6 % (ref 4.8–5.6)
Mean Plasma Glucose: 114.02 mg/dL

## 2020-05-29 LAB — HIV ANTIBODY (ROUTINE TESTING W REFLEX): HIV Screen 4th Generation wRfx: NONREACTIVE

## 2020-05-29 MED ORDER — ASPIRIN EC 325 MG PO TBEC
325.0000 mg | DELAYED_RELEASE_TABLET | Freq: Every day | ORAL | 1 refills | Status: AC
Start: 2020-05-29 — End: 2020-06-28

## 2020-05-29 MED ORDER — SIMVASTATIN 10 MG PO TABS
10.0000 mg | ORAL_TABLET | Freq: Every evening | ORAL | 11 refills | Status: DC
Start: 2020-05-29 — End: 2021-07-28

## 2020-05-29 NOTE — Discharge Summary (Signed)
Physician Discharge Summary  Zachary [name] ZTI:[id number] DOB: June 22, 1971 DOA: 05/28/2020  PCP: Patient, No Pcp Per  Admit date: 05/28/2020  Discharge date: 05/29/2020  Admitted From:Home  Disposition:  Home  Recommendations for Outpatient Follow-up:  1. Follow up with PCP in 1-2 weeks 2. Follow-up with Dr. Merlene Laughter with neurology in 4-6 weeks as recommended 3. Continue on full dose aspirin over the next 30 days 4. Continue on statin as prescribed  Home Health: None  Equipment/Devices: None  Discharge Condition: Stable  CODE STATUS: Full  Diet recommendation: Heart Healthy  Brief/Interim Summary: Per HPI: Zachary [name] is an 49 y.o. male with PMH significant for chronic headaches which have never been worked up, on no chronic medications who does smoke 2 marijuana blunts a day for the past 15-20 years was in his usual state of health until earlier this afternoon when he developed right facial numbness while playing with his pet lizard.  Patient states that he developed a "weird numb feeling" on the right side of his face which lasted about 15 minutes and then resolved with residual numbness near his lips and forehead area.  He notes that after the numbness resolved he developed a headache on the right side with radiation behind his right ear.  Patient subsequently notes that he felt dizzy and "swimmy headed" so decided to come to the ED at his wife's urging.  Patient notes that by the time he came to the ED most of his symptoms had resolved although he did have some residual tingling sensation at his right lip and by his right eye.  Dizziness had resolved.  Patient notes his headaches are normally bilateral frontal headaches and it is rare for him to have unisided headaches although he has had those before.  He does not recall ever having symptoms like this before.  Denies any history of known migraines although states he does not have a PCP and has not seen a doctor for many  years.  Patient denies fevers chills or malaise.  No cough no shortness of breath.  No palpitations or chest pain.  No photophobia.  No recent injuries.  ED Course:  The patient was called in as a code stroke.  Head CT was negative.  He was seen by neurology who noted this might be a atypical migraine but would still recommend admission for TIA work-up.  Patient was started on aspirin 325 mg per their recommendations.  -Patient was admitted with facial numbness that resolved fairly quickly.  No significant findings noted on 2D echocardiogram or carotid Dopplers.  Please see results as noted below.  Discussed case with Dr. Merlene Laughter who recommends full dose aspirin as well as statin for now along with follow-up in the outpatient setting in the next 4-6 weeks.  Patient has no other focal deficits or concerns at this time and is stable for discharge with no other acute events during the course of this brief admission.  Discharge Diagnoses:  Principal Problem:   TIA (transient ischemic attack) Active Problems:   Migraine variant  Principal discharge diagnosis: TIA versus atypical migraine.  Discharge Instructions  Discharge Instructions    Diet - low sodium heart healthy   Complete by: As directed    Increase activity slowly   Complete by: As directed      Allergies as of 05/29/2020   No Known Allergies     Medication List    TAKE these medications   aspirin EC 325 MG tablet Take 1 tablet (  325 mg total) by mouth daily.   simvastatin 10 MG tablet Commonly known as: Zocor Take 1 tablet (10 mg total) by mouth every evening.       Follow-up Information    Phillips Odor, MD. Schedule an appointment as soon as possible for a visit in 4 week(s).   Specialty: Neurology Contact information: [address] [place] [address] [phone]              No Known Allergies  Consultations:  None   Procedures/Studies: DG Chest 2 View  Result Date:  05/28/2020 CLINICAL DATA:  Dizziness EXAM: CHEST - 2 VIEW COMPARISON:  01/09/2018 FINDINGS: The heart size and mediastinal contours are within normal limits. Both lungs are clear. The visualized skeletal structures are unremarkable. IMPRESSION: No active cardiopulmonary disease. Electronically Signed   By: Ulyses Jarred M.D.   On: 05/28/2020 19:44   MR BRAIN WO CONTRAST  Result Date: 05/28/2020 CLINICAL DATA:  Neuro deficit, acute, stroke suspected. EXAM: MRI HEAD WITHOUT CONTRAST TECHNIQUE: Multiplanar, multiecho pulse sequences of the brain and surrounding structures were obtained without intravenous contrast. COMPARISON:  Head CT May 28, 2020 FINDINGS: The study is partially degraded by motion. Brain: No acute infarction, hemorrhage, hydrocephalus, extra-axial collection or mass lesion. Vascular: Normal flow voids. Skull and upper cervical spine: No focal lesion. Sinuses/Orbits: Negative. Other: None. IMPRESSION: 1. Motion degraded study. 2. No evidence of acute intracranial abnormality. Electronically Signed   By: Pedro Earls M.D.   On: 05/28/2020 15:48   US Carotid Bilateral (at [facility] and [facility] only)  Result Date: 05/29/2020 CLINICAL DATA:  Vertigo, right facial numbness, previous tobacco abuse EXAM: BILATERAL CAROTID DUPLEX ULTRASOUND TECHNIQUE: Pearline Cables scale imaging, color Doppler and duplex ultrasound were performed of bilateral carotid and vertebral arteries in the neck. COMPARISON:  None. FINDINGS: Criteria: Quantification of carotid stenosis is based on velocity parameters that correlate the residual internal carotid diameter with NASCET-based stenosis levels, using the diameter of the distal internal carotid lumen as the denominator for stenosis measurement. The following velocity measurements were obtained: RIGHT ICA: 74/29 cm/sec CCA: 628/36 cm/sec SYSTOLIC ICA/CCA RATIO:  0.5 ECA: 92 cm/sec LEFT ICA: 61/24 cm/sec CCA: 629/47 cm/sec SYSTOLIC ICA/CCA RATIO:  0.5 ECA: 88 cm/sec RIGHT  CAROTID ARTERY: No significant plaque or stenosis. Normal waveforms and color Doppler signal. RIGHT VERTEBRAL ARTERY:  Normal flow direction and waveform. LEFT CAROTID ARTERY: Mild smooth plaque in the left carotid bulb without stenosis. Normal waveforms and color Doppler signal. LEFT VERTEBRAL ARTERY:  Normal flow direction and waveform. IMPRESSION: 1. No significant right carotid plaque. 2. Mild smooth plaque in the left carotid bulb without stenosis. 3.  Antegrade bilateral vertebral arterial flow. Electronically Signed   By: Lucrezia Europe M.D.   On: 05/29/2020 12:39   ECHOCARDIOGRAM COMPLETE  Result Date: 05/29/2020    ECHOCARDIOGRAM REPORT   Patient Name:   MANPREET STREY Date of Exam: 05/29/2020 Medical Rec #:  [id number]      Height:       72.0 in Accession #:    [id number]     Weight:       272.5 lb Date of Birth:  1970/12/27       BSA:          2.430 m Patient Age:    49 years       BP:           132/88 mmHg Patient Gender: M  HR:           51 bpm. Exam Location:  [facility] Procedure: 2D Echo Indications:    TIA 435.9 / G45.9  History:        Patient has no prior history of Echocardiogram examinations.                 TIA; Risk Factors:Former Smoker. Psoriasis, Migraine Variant,                 Left leg weakness.  Sonographer:    Leavy Cella RDCS ([facility]) Referring Phys: [id number] [place]  1. Left ventricular ejection fraction, by estimation, is 60 to 65%. The left ventricle has normal function. The left ventricle has no regional wall motion abnormalities. Left ventricular diastolic parameters were normal.  2. Right ventricular systolic function is normal. The right ventricular size is normal.  3. Left atrial size was mildly dilated.  4. The mitral valve is normal in structure. Trivial mitral valve regurgitation. No evidence of mitral stenosis.  5. The aortic valve is normal in structure. Aortic valve regurgitation is not visualized. No aortic stenosis is present.  6.  The inferior vena cava is normal in size with greater than 50% respiratory variability, suggesting right atrial pressure of 3 mmHg. FINDINGS  Left Ventricle: Left ventricular ejection fraction, by estimation, is 60 to 65%. The left ventricle has normal function. The left ventricle has no regional wall motion abnormalities. The left ventricular internal cavity size was normal in size. There is  no left ventricular hypertrophy. Left ventricular diastolic parameters were normal. Right Ventricle: The right ventricular size is normal. No increase in right ventricular wall thickness. Right ventricular systolic function is normal. Left Atrium: Left atrial size was mildly dilated. Right Atrium: Right atrial size was normal in size. Pericardium: There is no evidence of pericardial effusion. Mitral Valve: The mitral valve is normal in structure. Normal mobility of the mitral valve leaflets. Trivial mitral valve regurgitation. No evidence of mitral valve stenosis. Tricuspid Valve: The tricuspid valve is normal in structure. Tricuspid valve regurgitation is not demonstrated. No evidence of tricuspid stenosis. Aortic Valve: The aortic valve is normal in structure. Aortic valve regurgitation is not visualized. No aortic stenosis is present. Pulmonic Valve: The pulmonic valve was normal in structure. Pulmonic valve regurgitation is not visualized. No evidence of pulmonic stenosis. Aorta: The aortic root is normal in size and structure. Venous: The inferior vena cava is normal in size with greater than 50% respiratory variability, suggesting right atrial pressure of 3 mmHg. IAS/Shunts: No atrial level shunt detected by color flow Doppler.  LEFT VENTRICLE PLAX 2D LVIDd:         5.17 cm  Diastology LVIDs:         3.18 cm  LV e' lateral:   11.20 cm/s LV PW:         1.17 cm  LV E/e' lateral: 6.5 LV IVS:        1.10 cm  LV e' medial:    10.60 cm/s LVOT diam:     2.00 cm  LV E/e' medial:  6.9 LVOT Area:     3.14 cm  RIGHT VENTRICLE RV S  prime:     12.00 cm/s TAPSE (M-mode): 3.6 cm LEFT ATRIUM             Index       RIGHT ATRIUM           Index LA diam:  3.80 cm 1.56 cm/m  RA Area:     14.00 cm LA Vol (A2C):   40.9 ml 16.83 ml/m RA Volume:   34.90 ml  14.36 ml/m LA Vol (A4C):   25.9 ml 10.66 ml/m LA Biplane Vol: 32.6 ml 13.42 ml/m   AORTA Ao Root diam: 3.00 cm MITRAL VALVE MV Area (PHT): 2.73 cm    SHUNTS MV Decel Time: 278 msec    Systemic Diam: 2.00 cm MV E velocity: 73.20 cm/s MV A velocity: 47.10 cm/s MV E/A ratio:  1.55 Jenkins Rouge MD Electronically signed by Jenkins Rouge MD Signature Date/Time: 05/29/2020/9:33:29 AM    Final    CT HEAD CODE STROKE WO CONTRAST  Result Date: 05/28/2020 CLINICAL DATA:  Code stroke.  Right facial numbness. EXAM: CT HEAD WITHOUT CONTRAST TECHNIQUE: Contiguous axial images were obtained from the base of the skull through the vertex without intravenous contrast. COMPARISON:  01/09/2018 FINDINGS: Brain: There is no evidence of acute infarct, intracranial hemorrhage, mass, midline shift, or extra-axial fluid collection. Asymmetry of the lateral ventricles is unchanged and likely developmental. Vascular: No hyperdense vessel. Skull: No fracture or suspicious osseous lesion. Sinuses/Orbits: Visualized paranasal sinuses and mastoid air cells are clear. Orbits are unremarkable. Other: None. ASPECTS [facility] Stroke Program Early CT Score) - Ganglionic level infarction (caudate, lentiform nuclei, internal capsule, insula, M1-M3 cortex): 7 - Supraganglionic infarction (M4-M6 cortex): 3 Total score (0-10 with 10 being normal): 10 IMPRESSION: Negative head CT.  ASPECTS is 10. These results were called by telephone at the time of interpretation on 05/28/2020 at 2:15 pm to Dr. Noemi Chapel , who verbally acknowledged these results. Electronically Signed   By: Logan Bores M.D.   On: 05/28/2020 14:15     Discharge Exam: Vitals:   05/28/20 1830 05/28/20 2053  BP: 136/85 (!) 128/98  Pulse: (!) 56 71  Resp: 18  20  Temp: 97.9 F (36.6 C) 98.3 F (36.8 C)  SpO2: 99% 98%   Vitals:   05/28/20 1700 05/28/20 1800 05/28/20 1830 05/28/20 2053  BP: 133/84 (!) 126/93 136/85 (!) 128/98  Pulse: 62 (!) 59 (!) 56 71  Resp: 16 17 18 20   Temp:   97.9 F (36.6 C) 98.3 F (36.8 C)  TempSrc:   Oral Oral  SpO2: 95% 98% 99% 98%  Weight:      Height:        General: Pt is alert, awake, not in acute distress Cardiovascular: RRR, S1/S2 +, no rubs, no gallops Respiratory: CTA bilaterally, no wheezing, no rhonchi Abdominal: Soft, NT, ND, bowel sounds + Extremities: no edema, no cyanosis    The results of significant diagnostics from this hospitalization (including imaging, microbiology, ancillary and laboratory) are listed below for reference.     Microbiology: Recent Results (from the past 240 hour(s))  Respiratory Panel by RT PCR (Flu A&B, Covid) - Nasopharyngeal Swab     Status: None   Collection Time: 05/28/20  5:54 PM   Specimen: Nasopharyngeal Swab  Result Value Ref Range Status   SARS Coronavirus 2 by RT PCR NEGATIVE NEGATIVE Final    Comment: (NOTE) SARS-CoV-2 target nucleic acids are NOT DETECTED. The SARS-CoV-2 RNA is generally detectable in upper respiratoy specimens during the acute phase of infection. The lowest concentration of SARS-CoV-2 viral copies this assay can detect is 131 copies/mL. A negative result does not preclude SARS-Cov-2 infection and should not be used as the sole basis for treatment or other patient management decisions. A negative result may occur with  improper specimen collection/handling, submission of specimen other than nasopharyngeal swab, presence of viral mutation(s) within the areas targeted by this assay, and inadequate number of viral copies (<131 copies/mL). A negative result must be combined with clinical observations, patient history, and epidemiological information. The expected result is Negative. Fact Sheet for Patients:   PinkCheek.be Fact Sheet for Healthcare Providers:  GravelBags.it This test is not yet ap proved or cleared by the [place] FDA and  has been authorized for detection and/or diagnosis of SARS-CoV-2 by FDA under an Emergency Use Authorization (EUA). This EUA will remain  in effect (meaning this test can be used) for the duration of the COVID-19 declaration under Section 564(b)(1) of the Act, 21 U.S.C. section 360bbb-3(b)(1), unless the authorization is terminated or revoked sooner.    Influenza A by PCR NEGATIVE NEGATIVE Final   Influenza B by PCR NEGATIVE NEGATIVE Final    Comment: (NOTE) The Xpert Xpress SARS-CoV-2/FLU/RSV assay is intended as an aid in  the diagnosis of influenza from Nasopharyngeal swab specimens and  should not be used as a sole basis for treatment. Nasal washings and  aspirates are unacceptable for Xpert Xpress SARS-CoV-2/FLU/RSV  testing. Fact Sheet for Patients: PinkCheek.be Fact Sheet for Healthcare Providers: GravelBags.it This test is not yet approved or cleared by the [place] FDA and  has been authorized for detection and/or diagnosis of SARS-CoV-2 by  FDA under an Emergency Use Authorization (EUA). This EUA will remain  in effect (meaning this test can be used) for the duration of the  Covid-19 declaration under Section 564(b)(1) of the Act, 21  U.S.C. section 360bbb-3(b)(1), unless the authorization is  terminated or revoked. Performed at [facility], [address], [place], [place] [address]      Labs: BNP (last 3 results) No results for input(s): BNP in the last 8760 hours. Basic Metabolic Panel: Recent Labs  Lab 05/28/20 1410 05/28/20 1418 05/29/20 0624  NA 138 139 138  K 5.4* 5.5* 4.1  CL 105 103 103  CO2 26  --  26  GLUCOSE 97 93 100*  BUN 14 18 12   CREATININE 1.36* 1.40* 1.13  CALCIUM 8.8*  --  8.8*    Liver Function Tests: Recent Labs  Lab 05/28/20 1410  AST 33  ALT 27  ALKPHOS 55  BILITOT 1.4*  PROT 7.4  ALBUMIN 4.1   No results for input(s): LIPASE, AMYLASE in the last 168 hours. No results for input(s): AMMONIA in the last 168 hours. CBC: Recent Labs  Lab 05/28/20 1410 05/28/20 1418 05/29/20 0624  WBC 11.1*  --  8.6  NEUTROABS 5.9  --   --   HGB 15.3 15.3 15.8  HCT 44.3 45.0 46.3  MCV 88.8  --  88.0  PLT 245  --  250   Cardiac Enzymes: No results for input(s): CKTOTAL, CKMB, CKMBINDEX, TROPONINI in the last 168 hours. BNP: Invalid input(s): POCBNP CBG: Recent Labs  Lab 05/28/20 1408  GLUCAP 94   D-Dimer No results for input(s): DDIMER in the last 72 hours. Hgb A1c Recent Labs    05/29/20 0624  HGBA1C 5.6   Lipid Profile Recent Labs    05/29/20 0624  CHOL 194  HDL 27*  LDLCALC 125*  TRIG 211*  CHOLHDL 7.2   Thyroid function studies No results for input(s): TSH, T4TOTAL, T3FREE, THYROIDAB in the last 72 hours.  Invalid input(s): FREET3 Anemia work up No results for input(s): VITAMINB12, FOLATE, FERRITIN, TIBC, IRON, RETICCTPCT in the last 72 hours.  Urinalysis    Component Value Date/Time   COLORURINE YELLOW 01/09/2018 1334   APPEARANCEUR CLEAR 01/09/2018 1334   LABSPEC 1.018 01/09/2018 1334   PHURINE 6.0 01/09/2018 1334   GLUCOSEU NEGATIVE 01/09/2018 1334   HGBUR SMALL (A) 01/09/2018 1334   BILIRUBINUR NEGATIVE 01/09/2018 1334   KETONESUR NEGATIVE 01/09/2018 1334   PROTEINUR NEGATIVE 01/09/2018 1334   UROBILINOGEN 1.0 06/02/2011 0956   NITRITE NEGATIVE 01/09/2018 1334   LEUKOCYTESUR NEGATIVE 01/09/2018 1334   Sepsis Labs Invalid input(s): PROCALCITONIN,  WBC,  LACTICIDVEN Microbiology Recent Results (from the past 240 hour(s))  Respiratory Panel by RT PCR (Flu A&B, Covid) - Nasopharyngeal Swab     Status: None   Collection Time: 05/28/20  5:54 PM   Specimen: Nasopharyngeal Swab  Result Value Ref Range Status   SARS  Coronavirus 2 by RT PCR NEGATIVE NEGATIVE Final    Comment: (NOTE) SARS-CoV-2 target nucleic acids are NOT DETECTED. The SARS-CoV-2 RNA is generally detectable in upper respiratoy specimens during the acute phase of infection. The lowest concentration of SARS-CoV-2 viral copies this assay can detect is 131 copies/mL. A negative result does not preclude SARS-Cov-2 infection and should not be used as the sole basis for treatment or other patient management decisions. A negative result may occur with  improper specimen collection/handling, submission of specimen other than nasopharyngeal swab, presence of viral mutation(s) within the areas targeted by this assay, and inadequate number of viral copies (<131 copies/mL). A negative result must be combined with clinical observations, patient history, and epidemiological information. The expected result is Negative. Fact Sheet for Patients:  PinkCheek.be Fact Sheet for Healthcare Providers:  GravelBags.it This test is not yet ap proved or cleared by the [place] FDA and  has been authorized for detection and/or diagnosis of SARS-CoV-2 by FDA under an Emergency Use Authorization (EUA). This EUA will remain  in effect (meaning this test can be used) for the duration of the COVID-19 declaration under Section 564(b)(1) of the Act, 21 U.S.C. section 360bbb-3(b)(1), unless the authorization is terminated or revoked sooner.    Influenza A by PCR NEGATIVE NEGATIVE Final   Influenza B by PCR NEGATIVE NEGATIVE Final    Comment: (NOTE) The Xpert Xpress SARS-CoV-2/FLU/RSV assay is intended as an aid in  the diagnosis of influenza from Nasopharyngeal swab specimens and  should not be used as a sole basis for treatment. Nasal washings and  aspirates are unacceptable for Xpert Xpress SARS-CoV-2/FLU/RSV  testing. Fact Sheet for Patients: PinkCheek.be Fact Sheet  for Healthcare Providers: GravelBags.it This test is not yet approved or cleared by the [place] FDA and  has been authorized for detection and/or diagnosis of SARS-CoV-2 by  FDA under an Emergency Use Authorization (EUA). This EUA will remain  in effect (meaning this test can be used) for the duration of the  Covid-19 declaration under Section 564(b)(1) of the Act, 21  U.S.C. section 360bbb-3(b)(1), unless the authorization is  terminated or revoked. Performed at [facility], [address], [place], [place] [address]      Time coordinating discharge: 35 minutes  SIGNED:   Rodena Goldmann, DO [facility] 05/29/2020, 12:59 PM  If 7PM-7AM, please contact night-coverage [email or web address]

## 2020-05-29 NOTE — Consult Note (Signed)
SLP Cancellation Note  Patient Details Name: [name] MRN: [id number] DOB: 05/15/1971   Cancelled treatment:       Reason Eval/Treat Not Completed: SLP screened, no needs identified, will sign off; symptoms resolved per pt; no facial weakness noted; MRI/CT head negative for acute issues.   Elvina Sidle, M.S., CCC-SLP 05/29/2020, 1:07 PM

## 2020-05-29 NOTE — Progress Notes (Signed)
Nsg Discharge Note  Admit Date:  05/28/2020 Discharge date: 05/29/2020   Jenny Reichmann A Faron to be D/C'd home per MD order.  AVS completed.  Copy for chart, and copy for patient signed, and dated. Patient/caregiver able to verbalize understanding.  Discharge Medication: Allergies as of 05/29/2020   No Known Allergies     Medication List    TAKE these medications   aspirin EC 325 MG tablet Take 1 tablet (325 mg total) by mouth daily.   simvastatin 10 MG tablet Commonly known as: Zocor Take 1 tablet (10 mg total) by mouth every evening.       Discharge Assessment: Vitals:   05/28/20 2053 05/29/20 1339  BP: (!) 128/98 (!) 121/97  Pulse: 71 70  Resp: 20 20  Temp: 98.3 F (36.8 C) 98.4 F (36.9 C)  SpO2: 98% 98%   Skin clean, dry and intact without evidence of skin break down, no evidence of skin tears noted. IV catheter discontinued intact. Site without signs and symptoms of complications - no redness or edema noted at insertion site, patient denies c/o pain - only slight tenderness at site.  Dressing with slight pressure applied.  D/c Instructions-Education: Discharge instructions given to patient/family with verbalized understanding. D/c education completed with patient/family including follow up instructions, medication list, d/c activities limitations if indicated, with other d/c instructions as indicated by MD - patient able to verbalize understanding, all questions fully answered. Patient instructed to return to ED, call 911, or call MD for any changes in condition.  Patient escorted via [place], and D/C home via private auto.  Zachery Conch, RN 05/29/2020 3:49 PM

## 2020-05-29 NOTE — Clinical Social Work Note (Signed)
Spoke with patient regarding being set up with [facility] as he is uninsured. Patient is agreeable to referral. Spoke with Diane at [facility] and referred patient. Diane advised that they will schedule appointment with patient.     Macenzie Burford, Clydene Pugh, LCSW

## 2020-05-29 NOTE — Progress Notes (Signed)
*  PRELIMINARY RESULTS* Echocardiogram 2D Echocardiogram has been performed.  Leavy Cella 05/29/2020, 9:33 AM

## 2020-07-15 IMAGING — CT CT HEAD CODE STROKE
3 series · 15 of 47 positions shown, 18 images · non-contrast
Comparison: 01/09/2018

CLINICAL DATA: Code stroke.  Right facial numbness.

EXAM:
CT HEAD WITHOUT CONTRAST
TECHNIQUE: Contiguous axial images were obtained from the base of the skull
through the vertex without intravenous contrast.

[Series 2: head w o · axial · 0.47mm/px · z∈[+70,+200]mm · 9 of 32 slices shown, 12 images]
[im 3/32  brain]
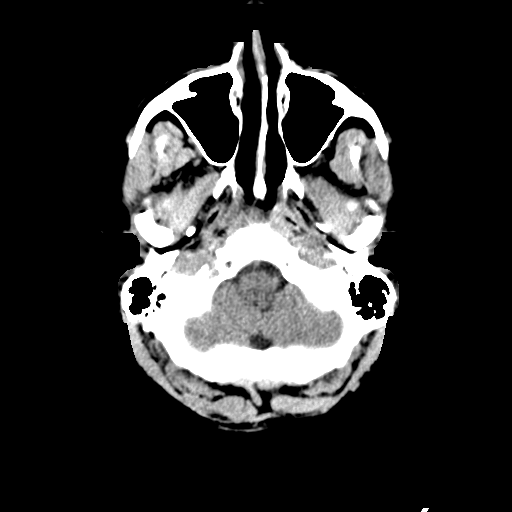
[im 3/32  bone]
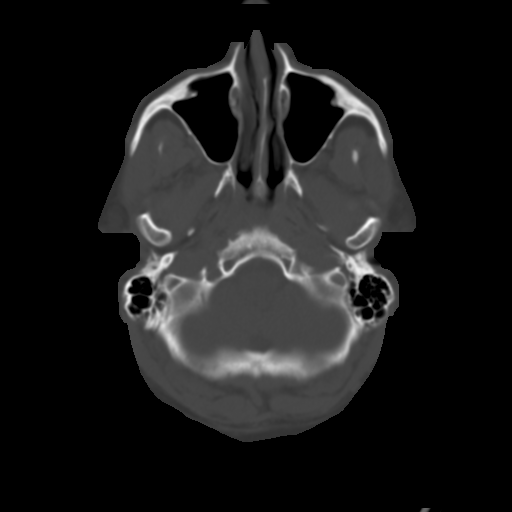
[im 6/32  brain]
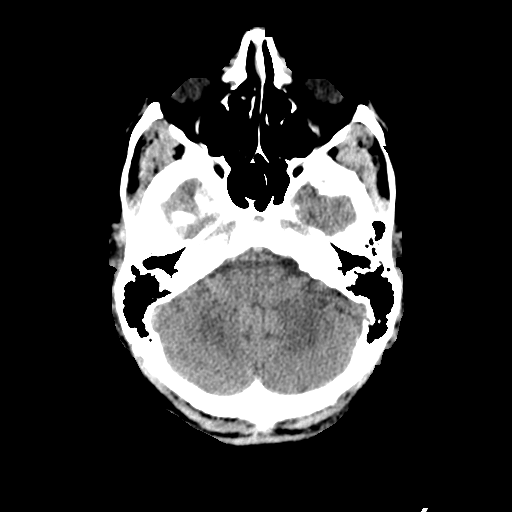
[im 9/32  brain]
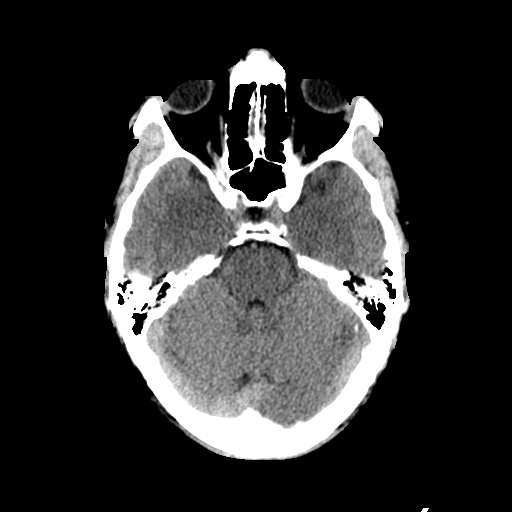
[im 12/32  brain]
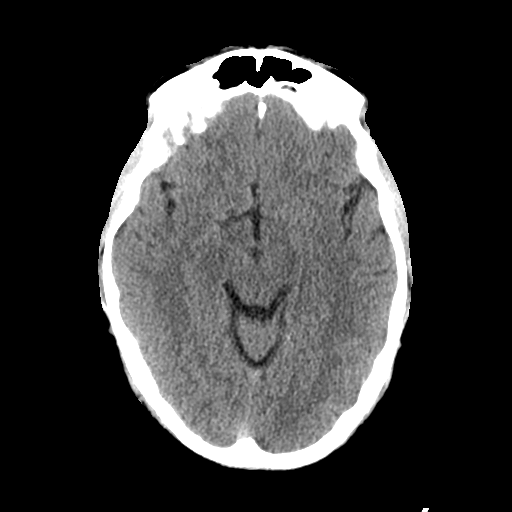
[im 17/32  brain]
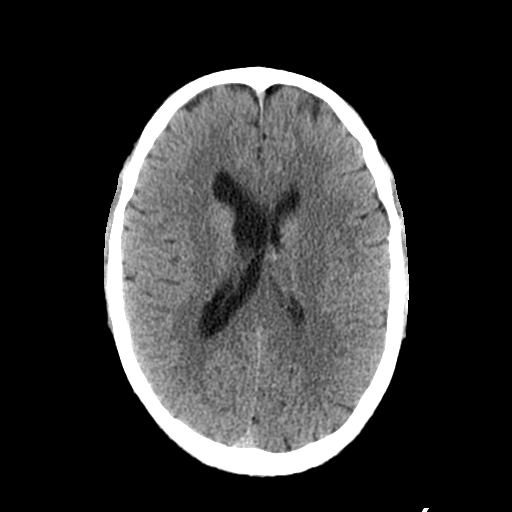
[im 17/32  bone]
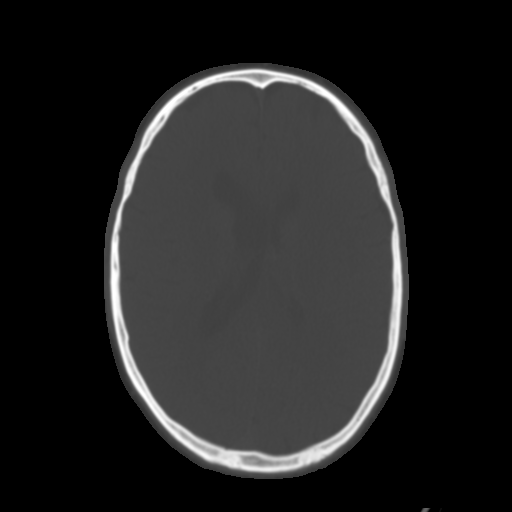
[im 20/32  brain]
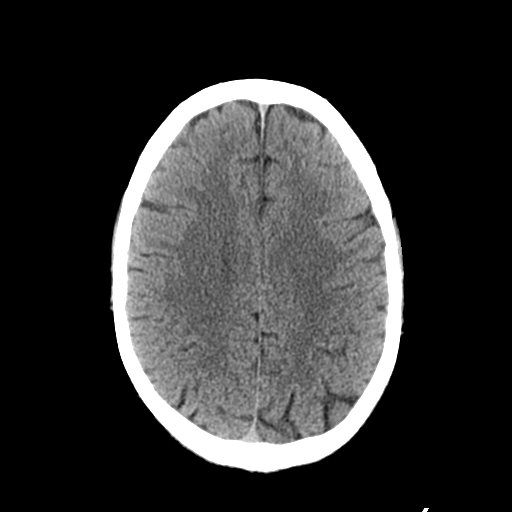
[im 23/32  brain]
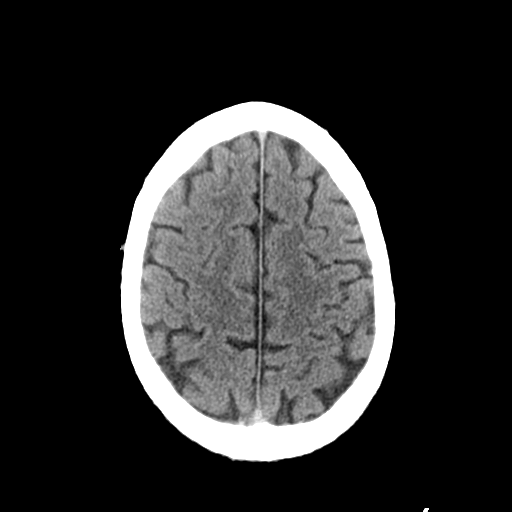
[im 26/32  brain]
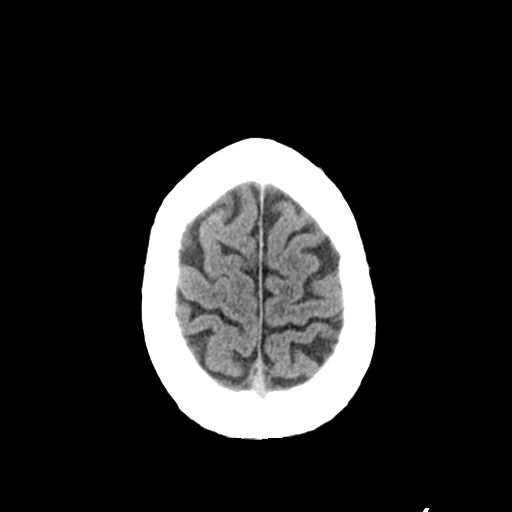
[im 29/32  brain]
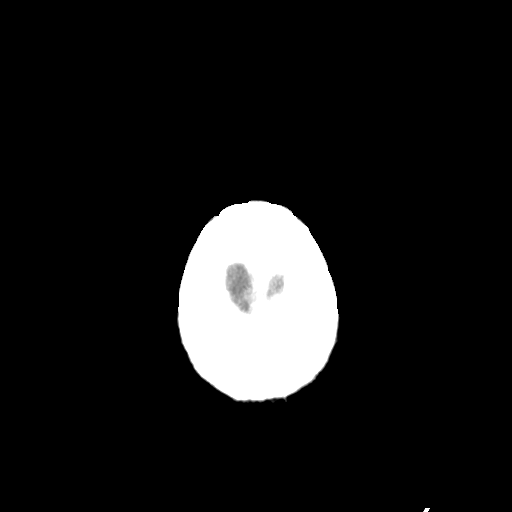
[im 29/32  bone]
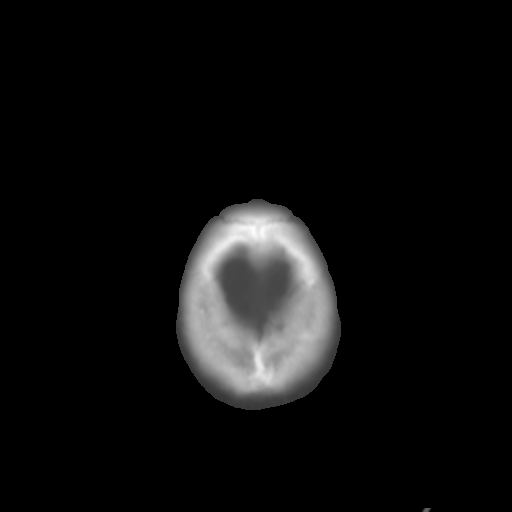

[Series 4: coronal soft · coronal · 0.32mm/px · 3 of 78 slices shown]
[im 26/78  brain]
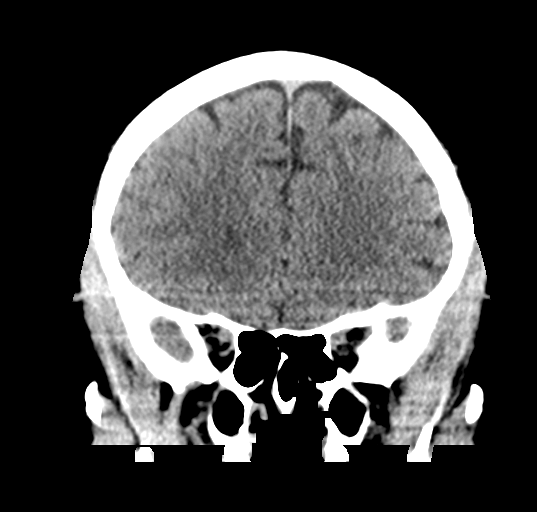
[im 35/78  brain]
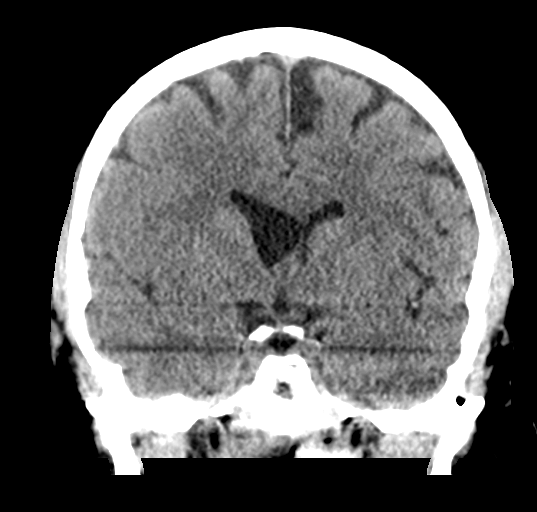
[im 43/78  brain]
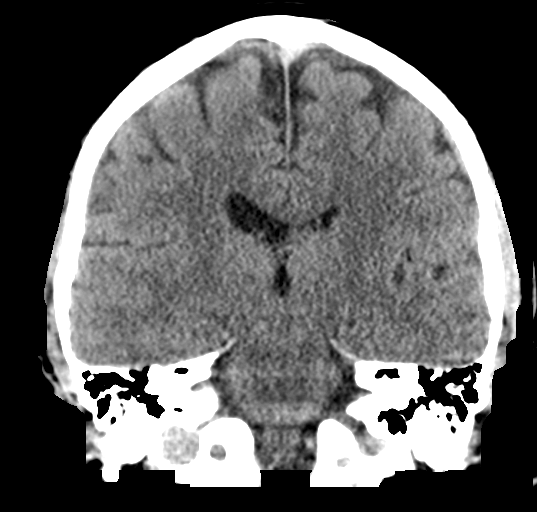

[Series 5: sagittal soft · sagittal · 0.35mm/px · 3 of 67 slices shown]
[im 23/67  brain]
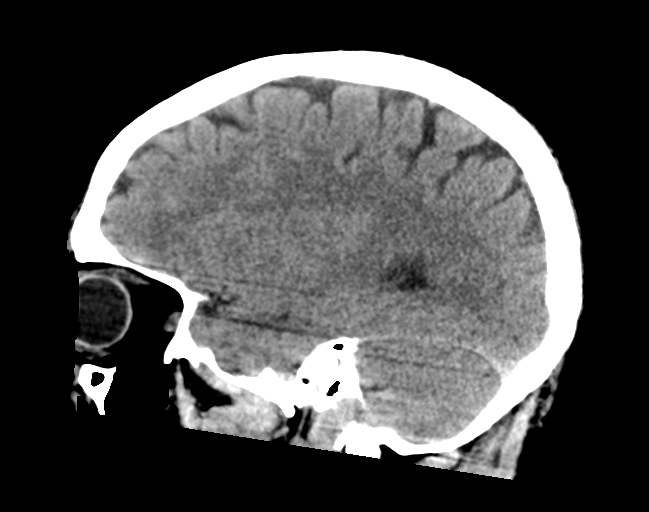
[im 34/67  brain]
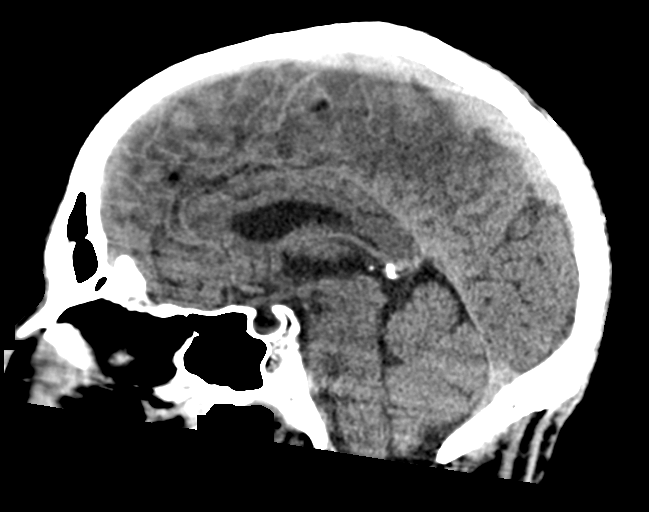
[im 45/67  brain]
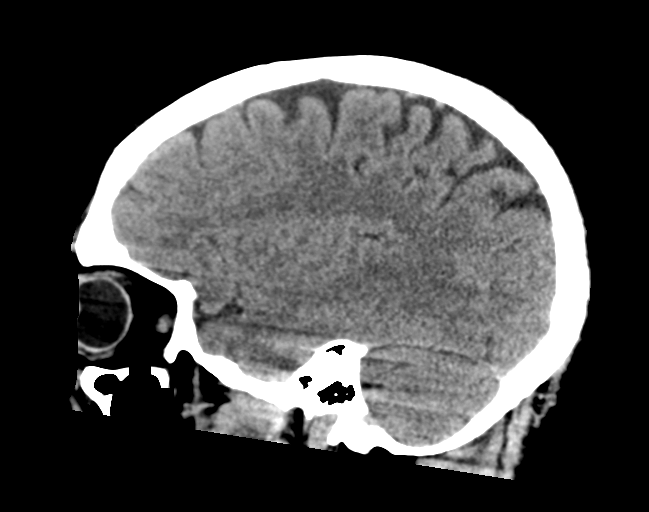

[15 of 47 positions shown; findings below may reference images not displayed]

FINDINGS: Brain: There is no evidence of acute infarct, intracranial
hemorrhage, mass, midline shift, or extra-axial fluid collection.
Asymmetry of the lateral ventricles is unchanged and likely
developmental.

Vascular: No hyperdense vessel.

Skull: No fracture or suspicious osseous lesion.

Sinuses/Orbits: Visualized paranasal sinuses and mastoid air cells
are clear. Orbits are unremarkable.

Other: None.

ASPECTS (Alberta Stroke Program Early CT Score)

- Ganglionic level infarction (caudate, lentiform nuclei, internal
capsule, insula, M1-M3 cortex): 7

- Supraganglionic infarction (M4-M6 cortex): 3

Total score (0-10 with 10 being normal): 10
IMPRESSION: Negative head CT.  ASPECTS is 10.

These results were called by telephone at the time of interpretation
on 05/28/2020 at [DATE] to Dr. Knut Egil Svalland , who verbally
acknowledged these results.

## 2021-04-21 ENCOUNTER — Other Ambulatory Visit: Payer: Self-pay

## 2021-04-21 ENCOUNTER — Encounter (HOSPITAL_COMMUNITY): Payer: Self-pay | Admitting: Emergency Medicine

## 2021-04-21 ENCOUNTER — Emergency Department (HOSPITAL_COMMUNITY)
Admission: EM | Admit: 2021-04-21 | Discharge: 2021-04-22 | Disposition: A | Discharge status: Home / Self Care | Payer: 59 | Attending: Emergency Medicine | Admitting: Emergency Medicine

## 2021-04-21 DIAGNOSIS — R21 Rash and other nonspecific skin eruption: Secondary | ICD-10-CM | POA: Diagnosis present

## 2021-04-21 DIAGNOSIS — Z87891 Personal history of nicotine dependence: Secondary | ICD-10-CM | POA: Diagnosis not present

## 2021-04-21 NOTE — ED Triage Notes (Signed)
Pt c/o poison ivy to various parts of body x 4-5 days that he now feels is getting into his eyes

## 2021-04-22 MED ORDER — PREDNISONE 10 MG PO TABS
10.0000 mg | ORAL_TABLET | Freq: Every day | ORAL | 0 refills | Status: DC
Start: 2021-04-22 — End: 2021-07-15

## 2021-04-22 MED ORDER — PREDNISONE 50 MG PO TABS
60.0000 mg | ORAL_TABLET | Freq: Once | ORAL | Status: AC
  Administered 2021-04-22: 60 mg via ORAL
  Filled 2021-04-22: qty 1

## 2021-04-22 MED ORDER — DIPHENHYDRAMINE HCL 25 MG PO CAPS
50.0000 mg | ORAL_CAPSULE | Freq: Once | ORAL | Status: AC
  Administered 2021-04-22: 50 mg via ORAL
  Filled 2021-04-22: qty 2

## 2021-04-22 MED ORDER — DIPHENHYDRAMINE HCL 25 MG PO TABS
25.0000 mg | ORAL_TABLET | Freq: Four times a day (QID) | ORAL | 0 refills | Status: DC | PRN
Start: 2021-04-22 — End: 2021-06-01

## 2021-04-22 NOTE — Discharge Instructions (Addendum)
You have been seen and discharged from the emergency department.  You have been diagnosed with a rash, this looks most likely like a poison ivy dermatitis.  Take Benadryl every 6 hours as directed.  Take steroid taper as prescribed.  Follow-up with your primary provider for reevaluation and further care. Take home medications as prescribed. If you have any worsening symptoms or further concerns for your health please return to an emergency department for further evaluation.

## 2021-04-22 NOTE — ED Provider Notes (Signed)
[facility] EMERGENCY DEPARTMENT Provider Note   CSN: [id number] Arrival date & time: 04/21/21  2146     History Chief Complaint  Patient presents with  . Poison Ivy    Zachary [name] is a 50 y.o. male.  HPI   50 year old male presents the emergency department with rash.  Patient states couple days ago he took his dog on a long walk in the woods.  Since then he has been developing rash that developed on his arms, neck and lower legs, all of which were exposed on his walk.  He believes that he got into poison ivy.  He has been using chamomile lotion without any improvement and comes to the ER tonight for evaluation and seeking relief.  Denies any fever.  Past Medical History:  Diagnosis Date  . Chicken pox   . Frequent headaches   . Psoriasis     Patient Active Problem List   Diagnosis Date Noted  . TIA (transient ischemic attack) 05/28/2020  . Migraine variant 05/28/2020  . Routine general medical examination at a health care facility 11/19/2015  . Psoriasis 10/28/2015  . Generalized headaches 10/28/2015  . Abdominal hernia 10/28/2015  . Left leg weakness 11/29/2011  . Stiffness of joints, not elsewhere classified, multiple sites 11/29/2011    Past Surgical History:  Procedure Laterality Date  . APPENDECTOMY    . BACK SURGERY    . FOOT SURGERY         Family History  Problem Relation Age of Onset  . Lupus Mother   . Prostate cancer Father   . Bladder Cancer Father   . Diabetes Father   . Colon cancer Maternal Grandmother   . Diabetes Maternal Grandmother     Social History   Tobacco Use  . Smoking status: Former Smoker    Packs/day: 1.50    Years: 30.00    Pack years: 45.00    Types: Cigarettes    Quit date: 06/03/2011    Years since quitting: 9.8  . Smokeless tobacco: Never Used  Vaping Use  . Vaping Use: Never used  Substance Use Topics  . Alcohol use: No  . Drug use: Yes    Types: Marijuana    Comment: last used today 05/28/20    Home  Medications Prior to Admission medications   Medication Sig Start Date End Date Taking? Authorizing Provider  diphenhydrAMINE (BENADRYL) 25 MG tablet Take 1 tablet (25 mg total) by mouth every 6 (six) hours as needed. 04/22/21  Yes Jonavin Seder, Alvin Critchley, DO  predniSONE (DELTASONE) 10 MG tablet Take 1 tablet (10 mg total) by mouth daily. Take 4 tablets (40 mg) once a day for the first five days Take 2 tablets (20 mg) once a day for the next five days Take 1 tablet (10 mg) once a day for the last five days 04/22/21  Yes Stefon Ramthun M, DO  simvastatin (ZOCOR) 10 MG tablet Take 1 tablet (10 mg total) by mouth every evening. 05/29/20 05/29/21  Heath Lark D, DO    Allergies    Patient has no known allergies.  Review of Systems   Review of Systems  Constitutional: Negative for fever.  Respiratory: Negative for shortness of breath.   Cardiovascular: Negative for chest pain.  Gastrointestinal: Negative for abdominal pain.  Skin: Positive for rash.  Neurological: Negative for headaches.    Physical Exam Updated Vital Signs BP (!) 141/97 (BP Location: Right Arm)   Pulse 82   Temp 98 F (36.7 C) (  Oral)   Resp 20   Ht 6' (1.829 m)   Wt 120.8 kg   SpO2 100%   BMI 36.13 kg/m   Physical Exam Vitals and nursing note reviewed.  Constitutional:      Appearance: Normal appearance.  HENT:     Head: Normocephalic.     Mouth/Throat:     Mouth: Mucous membranes are moist.  Cardiovascular:     Rate and Rhythm: Normal rate.  Pulmonary:     Effort: Pulmonary effort is normal. No respiratory distress.  Skin:    General: Skin is warm.     Comments: Scattered areas of raised scaly and erythematous rash that appears similar to a poison ivy dermatitis, the worst area is over the right knee, an approximate 6 x 4 inch circular area, no ulceration, no bleeding, no petechiae  Neurological:     Mental Status: He is alert and oriented to person, place, and time. Mental status is at baseline.  Psychiatric:         Mood and Affect: Mood normal.     ED Results / Procedures / Treatments   Labs (all labs ordered are listed, but only abnormal results are displayed) Labs Reviewed - No data to display  EKG None  Radiology No results found.  Procedures Procedures   Medications Ordered in ED Medications  diphenhydrAMINE (BENADRYL) capsule 50 mg (has no administration in time range)  predniSONE (DELTASONE) tablet 60 mg (has no administration in time range)    ED Course  I have reviewed the triage vital signs and the nursing notes.  Pertinent labs & imaging results that were available during my care of the patient were reviewed by me and considered in my medical decision making (see chart for details).    MDM Rules/Calculators/A&P                          50 year old male presents the emergency department with concern for rash, recently had a long walk in the woods, possible poison ivy contact.  Rash appears consistent with poison ivy dermatitis on the areas of skin that were exposed.  Vitals are normal, no other acute complaints.  Will treat symptomatically with Benadryl and steroids.  Patient will be discharged and treated as an outpatient.  Discharge plan and strict return to ED precautions discussed, patient verbalizes understanding and agreement.  Final Clinical Impression(s) / ED Diagnoses Final diagnoses:  Rash    Rx / DC Orders ED Discharge Orders         Ordered    diphenhydrAMINE (BENADRYL) 25 MG tablet  Every 6 hours PRN        04/22/21 0121    predniSONE (DELTASONE) 10 MG tablet  Daily        04/22/21 0121           Lorelle Gibbs, DO 04/22/21 0125

## 2021-06-01 ENCOUNTER — Ambulatory Visit
Admission: EM | Admit: 2021-06-01 | Discharge: 2021-06-01 | Disposition: A | Discharge status: Home / Self Care | Payer: 59 | Attending: Family Medicine | Admitting: Family Medicine

## 2021-06-01 ENCOUNTER — Other Ambulatory Visit: Payer: Self-pay

## 2021-06-01 ENCOUNTER — Encounter: Payer: Self-pay | Admitting: Emergency Medicine

## 2021-06-01 DIAGNOSIS — L2089 Other atopic dermatitis: Secondary | ICD-10-CM | POA: Diagnosis not present

## 2021-06-01 MED ORDER — TRIAMCINOLONE ACETONIDE 0.1 % EX CREA
1.0000 | TOPICAL_CREAM | Freq: Two times a day (BID) | CUTANEOUS | 0 refills | Status: DC
Start: 2021-06-01 — End: 2021-07-28

## 2021-06-01 MED ORDER — HYDROXYZINE HCL 25 MG PO TABS: 25.0000 mg | ORAL_TABLET | Freq: Three times a day (TID) | ORAL | 0 refills | Status: DC | PRN

## 2021-06-01 MED ORDER — DEXAMETHASONE SODIUM PHOSPHATE 10 MG/ML IJ SOLN
10.0000 mg | Freq: Once | INTRAMUSCULAR | Status: AC
  Administered 2021-06-01: 10 mg via INTRAMUSCULAR

## 2021-06-01 NOTE — ED Triage Notes (Signed)
Itchy rash all over x 2 months.

## 2021-06-01 NOTE — ED Provider Notes (Signed)
[facility]    CSN: [id number] Arrival date & time: 06/01/21  1655      History   Chief Complaint No chief complaint on file.   HPI Zachary [name] is a 50 y.o. male.   HPI Patient presents today with rash and generalized itching. This has been an ongoing problems over the last 2 months. Completed a course of steroids, rash did not resolve. He is itchy all over in areas where visible rash is not present, he has taken benadryl with minimal relief. Past Medical History:  Diagnosis Date   Chicken pox    Frequent headaches    Psoriasis     Patient Active Problem List   Diagnosis Date Noted   TIA (transient ischemic attack) 05/28/2020   Migraine variant 05/28/2020   Routine general medical examination at a health care facility 11/19/2015   Psoriasis 10/28/2015   Generalized headaches 10/28/2015   Abdominal hernia 10/28/2015   Left leg weakness 11/29/2011   Stiffness of joints, not elsewhere classified, multiple sites 11/29/2011    Past Surgical History:  Procedure Laterality Date   APPENDECTOMY     BACK SURGERY     FOOT SURGERY         Home Medications    Prior to Admission medications   Medication Sig Start Date End Date Taking? Authorizing Provider  hydrOXYzine (ATARAX/VISTARIL) 25 MG tablet Take 1-2 tablets (25-50 mg total) by mouth every 8 (eight) hours as needed. 06/01/21  Yes Scot Jun, FNP  triamcinolone cream (KENALOG) 0.1 % Apply 1 application topically 2 (two) times daily. 06/01/21  Yes Scot Jun, FNP  predniSONE (DELTASONE) 10 MG tablet Take 1 tablet (10 mg total) by mouth daily. Take 4 tablets (40 mg) once a day for the first five days Take 2 tablets (20 mg) once a day for the next five days Take 1 tablet (10 mg) once a day for the last five days 04/22/21   Horton, Kristie M, DO  simvastatin (ZOCOR) 10 MG tablet Take 1 tablet (10 mg total) by mouth every evening. 05/29/20 05/29/21  Heath Lark D, DO    Family History Family  History  Problem Relation Age of Onset   Lupus Mother    Prostate cancer Father    Bladder Cancer Father    Diabetes Father    Colon cancer Maternal Grandmother    Diabetes Maternal Grandmother     Social History Social History   Tobacco Use   Smoking status: Former    Packs/day: 1.50    Years: 30.00    Pack years: 45.00    Types: Cigarettes    Quit date: 06/03/2011    Years since quitting: 10.0   Smokeless tobacco: Never  Vaping Use   Vaping Use: Never used  Substance Use Topics   Alcohol use: No   Drug use: Yes    Types: Marijuana    Comment: last used today 05/28/20     Allergies   Patient has no known allergies.   Review of Systems Review of Systems Pertinent negatives listed in HPI   Physical Exam Triage Vital Signs ED Triage Vitals  Enc Vitals Group     BP 06/01/21 1756 112/69     Pulse Rate 06/01/21 1756 70     Resp 06/01/21 1756 16     Temp 06/01/21 1756 98.1 F (36.7 C)     Temp Source 06/01/21 1756 Temporal     SpO2 06/01/21 1756 97 %  Weight --      Height --      Head Circumference --      Peak Flow --      Pain Score 06/01/21 1757 0     Pain Loc --      Pain Edu? --      Excl. in [employer]? --    No data found.  Updated Vital Signs BP 112/69 (BP Location: Right Arm)   Pulse 70   Temp 98.1 F (36.7 C) (Temporal)   Resp 16   SpO2 97%   Visual Acuity Right Eye Distance:   Left Eye Distance:   Bilateral Distance:    Right Eye Near:   Left Eye Near:    Bilateral Near:     Physical Exam General appearance: alert, well developed, well nourished, cooperative  Head: Normocephalic, without obvious abnormality, atraumatic Respiratory: Respirations even and unlabored, normal respiratory rate Heart: Rate and rhythm normal.  Extremities: No gross deformities Skin: maculopapular rash, generalized  Psych: Appropriate mood and affect. UC Treatments / Results  Labs (all labs ordered are listed, but only abnormal results are  displayed) Labs Reviewed - No data to display  EKG   Radiology No results found.  Procedures Procedures (including critical care time)  Medications Ordered in UC Medications  dexamethasone (DECADRON) injection 10 mg (has no administration in time range)    Initial Impression / Assessment and Plan / UC Course  I have reviewed the triage vital signs and the nursing notes.  Pertinent labs & imaging results that were available during my care of the patient were reviewed by me and considered in my medical decision making (see chart for details).     Generalized rash with generalized pruritis.  Trial hydroxyzine and topical steroid cream. If no improvement recommend follow-up with dermatology. Final Clinical Impressions(s) / UC Diagnoses   Final diagnoses:  Other atopic dermatitis   Discharge Instructions   None    ED Prescriptions     Medication Sig Dispense Auth. Provider   hydrOXYzine (ATARAX/VISTARIL) 25 MG tablet Take 1-2 tablets (25-50 mg total) by mouth every 8 (eight) hours as needed. 18 tablet Scot Jun, FNP   triamcinolone cream (KENALOG) 0.1 % Apply 1 application topically 2 (two) times daily. 454 g Scot Jun, FNP      PDMP not reviewed this encounter.   Scot Jun, FNP 06/04/21 [phone]

## 2021-07-15 ENCOUNTER — Observation Stay (HOSPITAL_COMMUNITY): Payer: 59 | Admitting: Anesthesiology

## 2021-07-15 ENCOUNTER — Other Ambulatory Visit: Payer: Self-pay

## 2021-07-15 ENCOUNTER — Encounter (HOSPITAL_COMMUNITY)
Admission: EM | Disposition: A | Discharge status: Home / Self Care | Payer: Self-pay | Source: Home / Self Care | Attending: General Surgery

## 2021-07-15 ENCOUNTER — Inpatient Hospital Stay (HOSPITAL_COMMUNITY)
Admission: EM | Admit: 2021-07-15 | Discharge: 2021-07-19 | DRG: 354 | Disposition: A | Discharge status: Home / Self Care | Payer: 59 | Attending: General Surgery | Admitting: General Surgery

## 2021-07-15 ENCOUNTER — Emergency Department (HOSPITAL_COMMUNITY): Payer: 59

## 2021-07-15 ENCOUNTER — Encounter (HOSPITAL_COMMUNITY): Payer: Self-pay | Admitting: *Deleted

## 2021-07-15 DIAGNOSIS — Z832 Family history of diseases of the blood and blood-forming organs and certain disorders involving the immune mechanism: Secondary | ICD-10-CM

## 2021-07-15 DIAGNOSIS — K436 Other and unspecified ventral hernia with obstruction, without gangrene: Secondary | ICD-10-CM | POA: Diagnosis present

## 2021-07-15 DIAGNOSIS — Z833 Family history of diabetes mellitus: Secondary | ICD-10-CM

## 2021-07-15 DIAGNOSIS — Z8042 Family history of malignant neoplasm of prostate: Secondary | ICD-10-CM

## 2021-07-15 DIAGNOSIS — Z87891 Personal history of nicotine dependence: Secondary | ICD-10-CM

## 2021-07-15 DIAGNOSIS — K430 Incisional hernia with obstruction, without gangrene: Principal | ICD-10-CM

## 2021-07-15 DIAGNOSIS — K559 Vascular disorder of intestine, unspecified: Secondary | ICD-10-CM | POA: Diagnosis present

## 2021-07-15 DIAGNOSIS — Z20822 Contact with and (suspected) exposure to covid-19: Secondary | ICD-10-CM | POA: Diagnosis present

## 2021-07-15 HISTORY — PX: VENTRAL HERNIA REPAIR: SHX424

## 2021-07-15 LAB — BASIC METABOLIC PANEL
Anion gap: 8 (ref 5–15)
BUN: 16 mg/dL (ref 6–20)
CO2: 26 mmol/L (ref 22–32)
Calcium: 9.6 mg/dL (ref 8.9–10.3)
Chloride: 100 mmol/L (ref 98–111)
Creatinine, Ser: 1.18 mg/dL (ref 0.61–1.24)
GFR, Estimated: >60 mL/min (ref >60)
Glucose, Bld: 102 mg/dL — ABNORMAL HIGH (ref 70–99)
Potassium: 3.7 mmol/L (ref 3.5–5.1)
Sodium: 134 mmol/L — ABNORMAL LOW (ref 135–145)

## 2021-07-15 LAB — CBC WITH DIFFERENTIAL/PLATELET
Abs Immature Granulocytes: 0.03 10*3/uL (ref 0.00–0.07)
Basophils Absolute: 0 10*3/uL (ref 0.0–0.1)
Basophils Relative: 0 %
Eosinophils Absolute: 0.2 10*3/uL (ref 0.0–0.5)
Eosinophils Relative: 2 %
HCT: 46.5 % (ref 39.0–52.0)
Hemoglobin: 16.5 g/dL (ref 13.0–17.0)
Immature Granulocytes: 0 %
Lymphocytes Relative: 22 %
Lymphs Abs: 1.9 10*3/uL (ref 0.7–4.0)
MCH: 31.3 pg (ref 26.0–34.0)
MCHC: 35.5 g/dL (ref 30.0–36.0)
MCV: 88.1 fL (ref 80.0–100.0)
Monocytes Absolute: 0.5 10*3/uL (ref 0.1–1.0)
Monocytes Relative: 5 %
Neutro Abs: 6.1 10*3/uL (ref 1.7–7.7)
Neutrophils Relative %: 71 %
Platelets: 255 10*3/uL (ref 150–400)
RBC: 5.28 MIL/uL (ref 4.22–5.81)
RDW: 12.1 % (ref 11.5–15.5)
WBC: 8.6 10*3/uL (ref 4.0–10.5)
nRBC: 0 % (ref 0.0–0.2)

## 2021-07-15 LAB — HEPATIC FUNCTION PANEL
ALT: 36 U/L (ref 0–44)
AST: 34 U/L (ref 15–41)
Albumin: 4.7 g/dL (ref 3.5–5.0)
Alkaline Phosphatase: 63 U/L (ref 38–126)
Bilirubin, Direct: 0.2 mg/dL (ref 0.0–0.2)
Indirect Bilirubin: 0.8 mg/dL (ref 0.3–0.9)
Total Bilirubin: 1 mg/dL (ref 0.3–1.2)
Total Protein: 8.1 g/dL (ref 6.5–8.1)

## 2021-07-15 LAB — RESP PANEL BY RT-PCR (FLU A&B, COVID) ARPGX2
Influenza A by PCR: NEGATIVE
Influenza B by PCR: NEGATIVE
SARS Coronavirus 2 by RT PCR: NEGATIVE

## 2021-07-15 LAB — LIPASE, BLOOD: Lipase: 31 U/L (ref 11–51)

## 2021-07-15 SURGERY — REPAIR, HERNIA, VENTRAL
Anesthesia: General

## 2021-07-15 MED ORDER — ACETAMINOPHEN 500 MG PO TABS
1000.0000 mg | ORAL_TABLET | Freq: Four times a day (QID) | ORAL | Status: DC
  Administered 2021-07-15 – 2021-07-19 (×13): 1000 mg via ORAL
  Filled 2021-07-15 (×16): qty 2

## 2021-07-15 MED ORDER — OXYCODONE HCL 5 MG PO TABS
5.0000 mg | ORAL_TABLET | ORAL | Status: DC | PRN
  Administered 2021-07-16 – 2021-07-18 (×8): 10 mg via ORAL
  Administered 2021-07-19: 5 mg via ORAL
  Filled 2021-07-15 (×2): qty 2
  Filled 2021-07-15: qty 1
  Filled 2021-07-15 (×6): qty 2

## 2021-07-15 MED ORDER — FENTANYL CITRATE (PF) 100 MCG/2ML IJ SOLN: 25.0000 ug | INTRAMUSCULAR | Status: DC | PRN

## 2021-07-15 MED ORDER — BUPIVACAINE LIPOSOME 1.3 % IJ SUSP
INTRAMUSCULAR | Status: DC | PRN
  Administered 2021-07-15: 20 mL

## 2021-07-15 MED ORDER — MORPHINE SULFATE (PF) 2 MG/ML IV SOLN
2.0000 mg | INTRAVENOUS | Status: DC | PRN
  Administered 2021-07-16 (×2): 2 mg via INTRAVENOUS
  Filled 2021-07-15 (×2): qty 1

## 2021-07-15 MED ORDER — SUCCINYLCHOLINE CHLORIDE 200 MG/10ML IV SOSY
PREFILLED_SYRINGE | INTRAVENOUS | Status: AC
  Filled 2021-07-15: qty 10

## 2021-07-15 MED ORDER — KETOROLAC TROMETHAMINE 30 MG/ML IJ SOLN
INTRAMUSCULAR | Status: AC
  Filled 2021-07-15: qty 1

## 2021-07-15 MED ORDER — PROCHLORPERAZINE MALEATE 5 MG PO TABS: 10.0000 mg | ORAL_TABLET | Freq: Four times a day (QID) | ORAL | Status: DC | PRN

## 2021-07-15 MED ORDER — FENTANYL CITRATE (PF) 100 MCG/2ML IJ SOLN
100.0000 ug | INTRAMUSCULAR | Status: DC | PRN
  Administered 2021-07-15 (×2): 100 ug via INTRAVENOUS
  Filled 2021-07-15 (×2): qty 2

## 2021-07-15 MED ORDER — CEFAZOLIN SODIUM-DEXTROSE 2-4 GM/100ML-% IV SOLN
2.0000 g | INTRAVENOUS | Status: AC
  Administered 2021-07-15: 2 g via INTRAVENOUS

## 2021-07-15 MED ORDER — LACTATED RINGERS IV SOLN
INTRAVENOUS | Status: DC | PRN
Start: 2021-07-15 — End: 2021-07-15

## 2021-07-15 MED ORDER — IOHEXOL 300 MG/ML  SOLN
100.0000 mL | Freq: Once | INTRAMUSCULAR | Status: AC | PRN
  Administered 2021-07-15: 100 mL via INTRAVENOUS

## 2021-07-15 MED ORDER — ONDANSETRON HCL 4 MG/2ML IJ SOLN
4.0000 mg | Freq: Four times a day (QID) | INTRAMUSCULAR | Status: DC | PRN
  Administered 2021-07-16 (×2): 4 mg via INTRAVENOUS
  Filled 2021-07-15 (×2): qty 2

## 2021-07-15 MED ORDER — LACTATED RINGERS IV SOLN: INTRAVENOUS | Status: DC

## 2021-07-15 MED ORDER — BUPIVACAINE LIPOSOME 1.3 % IJ SUSP
INTRAMUSCULAR | Status: AC
  Filled 2021-07-15: qty 20

## 2021-07-15 MED ORDER — CEFAZOLIN SODIUM-DEXTROSE 2-4 GM/100ML-% IV SOLN
INTRAVENOUS | Status: AC
  Filled 2021-07-15: qty 100

## 2021-07-15 MED ORDER — 0.9 % SODIUM CHLORIDE (POUR BTL) OPTIME
TOPICAL | Status: DC | PRN
  Administered 2021-07-15: 1000 mL

## 2021-07-15 MED ORDER — ONDANSETRON 4 MG PO TBDP: 4.0000 mg | ORAL_TABLET | Freq: Four times a day (QID) | ORAL | Status: DC | PRN

## 2021-07-15 MED ORDER — DIPHENHYDRAMINE HCL 50 MG/ML IJ SOLN: 12.5000 mg | Freq: Four times a day (QID) | INTRAMUSCULAR | Status: DC | PRN

## 2021-07-15 MED ORDER — KETOROLAC TROMETHAMINE 30 MG/ML IJ SOLN
30.0000 mg | Freq: Once | INTRAMUSCULAR | Status: AC
  Administered 2021-07-15: 30 mg via INTRAVENOUS

## 2021-07-15 MED ORDER — SUCCINYLCHOLINE CHLORIDE 200 MG/10ML IV SOSY
PREFILLED_SYRINGE | INTRAVENOUS | Status: DC | PRN
  Administered 2021-07-15: 100 mg via INTRAVENOUS

## 2021-07-15 MED ORDER — FENTANYL CITRATE (PF) 100 MCG/2ML IJ SOLN
INTRAMUSCULAR | Status: DC | PRN
  Administered 2021-07-15 (×2): 50 ug via INTRAVENOUS
  Administered 2021-07-15: 100 ug via INTRAVENOUS
  Administered 2021-07-15 (×3): 50 ug via INTRAVENOUS

## 2021-07-15 MED ORDER — METHOCARBAMOL 500 MG PO TABS: 500.0000 mg | ORAL_TABLET | Freq: Four times a day (QID) | ORAL | Status: DC | PRN

## 2021-07-15 MED ORDER — DOCUSATE SODIUM 100 MG PO CAPS
100.0000 mg | ORAL_CAPSULE | Freq: Two times a day (BID) | ORAL | Status: DC
  Administered 2021-07-15 – 2021-07-19 (×8): 100 mg via ORAL
  Filled 2021-07-15 (×8): qty 1

## 2021-07-15 MED ORDER — DIPHENHYDRAMINE HCL 12.5 MG/5ML PO ELIX: 12.5000 mg | ORAL_SOLUTION | Freq: Four times a day (QID) | ORAL | Status: DC | PRN

## 2021-07-15 MED ORDER — IBUPROFEN 600 MG PO TABS
600.0000 mg | ORAL_TABLET | Freq: Four times a day (QID) | ORAL | Status: DC | PRN
  Administered 2021-07-17: 600 mg via ORAL
  Filled 2021-07-15: qty 1

## 2021-07-15 MED ORDER — CHLORHEXIDINE GLUCONATE CLOTH 2 % EX PADS: 6.0000 | MEDICATED_PAD | Freq: Once | CUTANEOUS | Status: DC

## 2021-07-15 MED ORDER — FENTANYL CITRATE (PF) 100 MCG/2ML IJ SOLN
INTRAMUSCULAR | Status: AC
  Filled 2021-07-15: qty 2

## 2021-07-15 MED ORDER — ONDANSETRON HCL 4 MG/2ML IJ SOLN
4.0000 mg | Freq: Once | INTRAMUSCULAR | Status: AC | PRN
  Administered 2021-07-15: 4 mg via INTRAVENOUS

## 2021-07-15 MED ORDER — ONDANSETRON HCL 4 MG/2ML IJ SOLN
4.0000 mg | Freq: Once | INTRAMUSCULAR | Status: AC
  Administered 2021-07-15: 4 mg via INTRAVENOUS
  Filled 2021-07-15: qty 2

## 2021-07-15 MED ORDER — LIDOCAINE HCL (PF) 2 % IJ SOLN
INTRAMUSCULAR | Status: AC
  Filled 2021-07-15: qty 5

## 2021-07-15 MED ORDER — METOPROLOL TARTRATE 5 MG/5ML IV SOLN
5.0000 mg | Freq: Four times a day (QID) | INTRAVENOUS | Status: DC | PRN
Start: 2021-07-15 — End: 2021-07-19

## 2021-07-15 MED ORDER — PROPOFOL 10 MG/ML IV BOLUS
INTRAVENOUS | Status: DC | PRN
  Administered 2021-07-15: 200 mg via INTRAVENOUS

## 2021-07-15 MED ORDER — ZOLPIDEM TARTRATE 5 MG PO TABS: 5.0000 mg | ORAL_TABLET | Freq: Every evening | ORAL | Status: DC | PRN

## 2021-07-15 MED ORDER — ROCURONIUM BROMIDE 10 MG/ML (PF) SYRINGE
PREFILLED_SYRINGE | INTRAVENOUS | Status: AC
  Filled 2021-07-15: qty 10

## 2021-07-15 MED ORDER — PROCHLORPERAZINE EDISYLATE 10 MG/2ML IJ SOLN: 5.0000 mg | Freq: Four times a day (QID) | INTRAMUSCULAR | Status: DC | PRN

## 2021-07-15 MED ORDER — SODIUM CHLORIDE 0.9 % IV SOLN: INTRAVENOUS | Status: DC

## 2021-07-15 MED ORDER — ONDANSETRON HCL 4 MG/2ML IJ SOLN
INTRAMUSCULAR | Status: AC
  Filled 2021-07-15: qty 2

## 2021-07-15 MED ORDER — ROCURONIUM BROMIDE 100 MG/10ML IV SOLN
INTRAVENOUS | Status: DC | PRN
  Administered 2021-07-15: 20 mg via INTRAVENOUS
  Administered 2021-07-15: 50 mg via INTRAVENOUS

## 2021-07-15 MED ORDER — PROPOFOL 10 MG/ML IV BOLUS
INTRAVENOUS | Status: AC
  Filled 2021-07-15: qty 20

## 2021-07-15 MED ORDER — SUGAMMADEX SODIUM 200 MG/2ML IV SOLN
INTRAVENOUS | Status: DC | PRN
  Administered 2021-07-15: 200 mg via INTRAVENOUS

## 2021-07-15 MED ORDER — MIDAZOLAM HCL 5 MG/5ML IJ SOLN
INTRAMUSCULAR | Status: DC | PRN
  Administered 2021-07-15: 2 mg via INTRAVENOUS

## 2021-07-15 MED ORDER — SIMETHICONE 80 MG PO CHEW: 40.0000 mg | CHEWABLE_TABLET | Freq: Four times a day (QID) | ORAL | Status: DC | PRN

## 2021-07-15 MED ORDER — FENTANYL CITRATE (PF) 250 MCG/5ML IJ SOLN
INTRAMUSCULAR | Status: AC
  Filled 2021-07-15: qty 5

## 2021-07-15 MED ORDER — PANTOPRAZOLE SODIUM 40 MG IV SOLR
40.0000 mg | Freq: Every day | INTRAVENOUS | Status: DC
  Administered 2021-07-15 – 2021-07-18 (×4): 40 mg via INTRAVENOUS
  Filled 2021-07-15 (×4): qty 40

## 2021-07-15 MED ORDER — MIDAZOLAM HCL 2 MG/2ML IJ SOLN
INTRAMUSCULAR | Status: AC
  Filled 2021-07-15: qty 2

## 2021-07-15 SURGICAL SUPPLY — 34 items
CHLORAPREP W/TINT 26 (MISCELLANEOUS) ×2 IMPLANT
CLOTH BEACON ORANGE TIMEOUT ST (SAFETY) ×2 IMPLANT
COVER LIGHT HANDLE STERIS (MISCELLANEOUS) ×4 IMPLANT
DRSG OPSITE POSTOP 4X8 (GAUZE/BANDAGES/DRESSINGS) ×4 IMPLANT
ELECT REM PT RETURN 9FT ADLT (ELECTROSURGICAL) ×2
ELECTRODE REM PT RTRN 9FT ADLT (ELECTROSURGICAL) ×1 IMPLANT
GAUZE SPONGE 4X4 12PLY STRL (GAUZE/BANDAGES/DRESSINGS) ×2 IMPLANT
GLOVE SURG ENC MOIS LTX SZ6.5 (GLOVE) ×2 IMPLANT
GLOVE SURG SS PI 7.5 STRL IVOR (GLOVE) ×2 IMPLANT
GLOVE SURG UNDER POLY LF SZ6.5 (GLOVE) ×2 IMPLANT
GLOVE SURG UNDER POLY LF SZ7 (GLOVE) ×4 IMPLANT
GOWN STRL REUS W/TWL LRG LVL3 (GOWN DISPOSABLE) ×4 IMPLANT
INST SET MAJOR GENERAL (KITS) ×2 IMPLANT
KIT TURNOVER KIT A (KITS) ×2 IMPLANT
MANIFOLD NEPTUNE II (INSTRUMENTS) ×2 IMPLANT
MESH VENTRALEX ST 8CM LRG (Mesh General) ×2 IMPLANT
NEEDLE HYPO 21X1.5 SAFETY (NEEDLE) ×2 IMPLANT
NS IRRIG 1000ML POUR BTL (IV SOLUTION) ×2 IMPLANT
PACK MAJOR ABDOMINAL (CUSTOM PROCEDURE TRAY) ×2 IMPLANT
PAD ARMBOARD 7.5X6 YLW CONV (MISCELLANEOUS) ×2 IMPLANT
SET BASIN LINEN APH (SET/KITS/TRAYS/PACK) ×2 IMPLANT
STAPLER VISISTAT (STAPLE) ×2 IMPLANT
SUT MNCRL AB 4-0 PS2 18 (SUTURE) ×2 IMPLANT
SUT NOVA NAB GS-21 1 T12 (SUTURE) IMPLANT
SUT NOVA NAB GS-22 2 2-0 T-19 (SUTURE) IMPLANT
SUT NOVA NAB GS-26 0 60 (SUTURE) IMPLANT
SUT SILK 2 0 (SUTURE)
SUT SILK 2-0 18XBRD TIE 12 (SUTURE) IMPLANT
SUT SILK 3 0 SH CR/8 (SUTURE) ×2 IMPLANT
SUT VIC AB 2-0 CT2 27 (SUTURE) ×2 IMPLANT
SUT VIC AB 3-0 SH 27 (SUTURE) ×1
SUT VIC AB 3-0 SH 27X BRD (SUTURE) ×1 IMPLANT
SUT VICRYL AB 2 0 TIES (SUTURE) IMPLANT
SYR 20ML LL LF (SYRINGE) ×2 IMPLANT

## 2021-07-15 NOTE — Progress Notes (Signed)
Patient arrived to floor via wheelchair due to staff elevators being non functional at this time.  Patient has abdominal binder with blood on bottom on binder, ( new) and patient has honeycomb dressing saturated with blood at bottom of dressing.  Dressing reinforced with abdominal pads and binder reapplied.

## 2021-07-15 NOTE — ED Provider Notes (Addendum)
[facility] EMERGENCY DEPARTMENT Provider Note   CSN: [id number] Arrival date & time: 07/15/21  1450     History Chief Complaint  Patient presents with   Abdominal Pain    Zachary [name] is a 50 y.o. male.  HPI He presents for evaluation of painful hernia on his abdominal wall.  He has a chronic hernia which never goes away, after having appendix surgery, complicated by gangrene requiring open procedure, several years ago.  Today the hernia hurts and it usually never does.  He has been nauseated but not vomited today.  He denies fever, chills or dizziness.  He has redness on his abdomen and arms which he states is from working in a hot environment.  There are no other known active modifying factors.    Past Medical History:  Diagnosis Date   Chicken pox    Frequent headaches    Psoriasis     Patient Active Problem List   Diagnosis Date Noted   Incarcerated ventral hernia 07/15/2021   TIA (transient ischemic attack) 05/28/2020   Migraine variant 05/28/2020   Routine general medical examination at a health care facility 11/19/2015   Psoriasis 10/28/2015   Generalized headaches 10/28/2015   Abdominal hernia 10/28/2015   Left leg weakness 11/29/2011   Stiffness of joints, not elsewhere classified, multiple sites 11/29/2011    Past Surgical History:  Procedure Laterality Date   APPENDECTOMY     BACK SURGERY     FOOT SURGERY         Family History  Problem Relation Age of Onset   Lupus Mother    Prostate cancer Father    Bladder Cancer Father    Diabetes Father    Colon cancer Maternal Grandmother    Diabetes Maternal Grandmother     Social History   Tobacco Use   Smoking status: Former    Packs/day: 1.50    Years: 30.00    Pack years: 45.00    Types: Cigarettes    Quit date: 06/03/2011    Years since quitting: 10.1   Smokeless tobacco: Never  Vaping Use   Vaping Use: Never used  Substance Use Topics   Alcohol use: No   Drug use: Yes    Types:  Marijuana    Comment: last used today 05/28/20    Home Medications Prior to Admission medications   Medication Sig Start Date End Date Taking? Authorizing Provider  hydrOXYzine (ATARAX/VISTARIL) 25 MG tablet Take 1-2 tablets (25-50 mg total) by mouth every 8 (eight) hours as needed. 06/01/21   Scot Jun, FNP  predniSONE (DELTASONE) 10 MG tablet Take 1 tablet (10 mg total) by mouth daily. Take 4 tablets (40 mg) once a day for the first five days Take 2 tablets (20 mg) once a day for the next five days Take 1 tablet (10 mg) once a day for the last five days 04/22/21   Horton, Kristie M, DO  simvastatin (ZOCOR) 10 MG tablet Take 1 tablet (10 mg total) by mouth every evening. 05/29/20 05/29/21  Manuella Ghazi, Pratik D, DO  triamcinolone cream (KENALOG) 0.1 % Apply 1 application topically 2 (two) times daily. 06/01/21   Scot Jun, FNP    Allergies    Patient has no known allergies.  Review of Systems   Review of Systems  All other systems reviewed and are negative.  Physical Exam Updated Vital Signs BP (!) 159/95 (BP Location: Left Arm)   Pulse 68   Temp 98.7 F (37.1 C) (  Oral)   Resp 20   Ht '6\' 1"'$  (1.854 m)   Wt 115.7 kg   SpO2 100%   BMI 33.64 kg/m   Physical Exam Vitals and nursing note reviewed.  Constitutional:      General: He is not in acute distress.    Appearance: He is well-developed. He is not ill-appearing, toxic-appearing or diaphoretic.  HENT:     Head: Normocephalic and atraumatic.     Right Ear: External ear normal.     Left Ear: External ear normal.  Eyes:     Conjunctiva/sclera: Conjunctivae normal.     Pupils: Pupils are equal, round, and reactive to light.  Neck:     Trachea: Phonation normal.  Cardiovascular:     Rate and Rhythm: Normal rate and regular rhythm.     Heart sounds: Normal heart sounds.  Pulmonary:     Effort: Pulmonary effort is normal.     Breath sounds: Normal breath sounds.  Abdominal:     General: There is no distension.      Palpations: Abdomen is soft.     Tenderness: There is abdominal tenderness (Left para umbilical mass).     Comments: Hernia present lower abdomen, beneath umbilicus.  Musculoskeletal:        General: Normal range of motion.     Cervical back: Normal range of motion and neck supple.  Skin:    General: Skin is warm and dry.  Neurological:     Mental Status: He is alert and oriented to person, place, and time.     Cranial Nerves: No cranial nerve deficit.     Sensory: No sensory deficit.     Motor: No abnormal muscle tone.     Coordination: Coordination normal.  Psychiatric:        Mood and Affect: Mood normal.        Behavior: Behavior normal.        Thought Content: Thought content normal.        Judgment: Judgment normal.    ED Results / Procedures / Treatments   Labs (all labs ordered are listed, but only abnormal results are displayed) Labs Reviewed  BASIC METABOLIC PANEL - Abnormal; Notable for the following components:      Result Value   Sodium 134 (*)    Glucose, Bld 102 (*)    All other components within normal limits  RESP PANEL BY RT-PCR (FLU A&B, COVID) ARPGX2  CBC WITH DIFFERENTIAL/PLATELET  HEPATIC FUNCTION PANEL  LIPASE, BLOOD    EKG  EKG Interpretation  Date/Time:  Wednesday July 15 2021 17:33:33 EDT Ventricular Rate:  74 PR Interval:  134 QRS Duration: 86 QT Interval:  371 QTC Calculation: 412 R Axis:   42 Text Interpretation: Sinus rhythm Confirmed by Randal Buba, April ([id number]) on 07/16/2021 9:44:16 AM        Radiology CT Abdomen Pelvis W Contrast  Result Date: 07/15/2021 CLINICAL DATA:  Severe abdominal pain with periumbilical hernia onset this afternoon. Prior appendectomy. EXAM: CT ABDOMEN AND PELVIS WITH CONTRAST TECHNIQUE: Multidetector CT imaging of the abdomen and pelvis was performed using the standard protocol following bolus administration of intravenous contrast. CONTRAST:  155m OMNIPAQUE IOHEXOL 300 MG/ML  SOLN COMPARISON:  None.  FINDINGS: Lower chest: No significant pulmonary nodules or acute consolidative airspace disease. Hepatobiliary: Normal liver size. No liver mass. Normal gallbladder with no radiopaque cholelithiasis. No biliary ductal dilatation. Pancreas: Normal, with no mass or duct dilation. Spleen: Normal size. No mass. Adrenals/Urinary Tract: Normal adrenals. No  renal stones. No hydronephrosis. Simple 1.4 cm upper left renal cyst. No additional contour deforming renal lesions. Normal bladder. Stomach/Bowel: Normal non-distended stomach. Large left periumbilical ventral abdominal hernia containing a dilated (4.1 cm diameter) mid small bowel loop with abrupt caliber transition at the hernia site. Slight wall thickening of the central portion of the herniated small bowel loop. Slight fat stranding and ill-defined fluid in the hernia sac. A few mildly dilated proximal small bowel loops in the upper abdomen up to 3.1 cm diameter. No additional sites of small bowel wall thickening. No definite pneumatosis. Appendectomy. Normal large bowel with no diverticulosis, large bowel wall thickening or pericolonic fat stranding. Vascular/Lymphatic: Atherosclerotic nonaneurysmal abdominal aorta. No pathologically enlarged lymph nodes in the abdomen or pelvis. Reproductive: Normal size prostate. Other: No pneumoperitoneum, ascites or focal fluid collection. Musculoskeletal: No aggressive appearing focal osseous lesions. Bilateral posterior spinal fusion at L5-S1. Mild-to-moderate thoracolumbar spondylosis. IMPRESSION: 1. Large left periumbilical ventral abdominal hernia containing an incarcerated dilated small bowel loop with early mechanical small-bowel obstruction with mildly dilated upstream small bowel loops. Slight wall thickening of the central portion of the herniated small bowel loop. Slight fat stranding and ill-defined fluid in the hernia sac. No definite pneumatosis or free air. Early ischemic change in the herniated small bowel loop  not excluded. Surgical consultation advised. 2. Aortic Atherosclerosis (ICD10-I70.0). Electronically Signed   By: Ilona Sorrel M.D.   On: 07/15/2021 17:26    Procedures .Critical Care  Date/Time: 07/15/2021 5:38 PM Performed by: Daleen Bo, MD Authorized by: Daleen Bo, MD   Critical care provider statement:    Critical care time (minutes):  35   Critical care start time:  07/15/2021 3:15 AM   Critical care end time:  07/15/2021 5:38 PM   Critical care time was exclusive of:  Separately billable procedures and treating other patients (Strangulated hernia)   Critical care was time spent personally by me on the following activities:  Blood draw for specimens, development of treatment plan with patient or surrogate, discussions with consultants, evaluation of patient's response to treatment, examination of patient, obtaining history from patient or surrogate, ordering and performing treatments and interventions, ordering and review of laboratory studies, pulse oximetry, re-evaluation of patient's condition, review of old charts and ordering and review of radiographic studies   Medications Ordered in ED Medications  0.9 %  sodium chloride infusion ( Intravenous New Bag/Given 07/15/21 1555)  fentaNYL (SUBLIMAZE) injection 100 mcg (100 mcg Intravenous Given 07/15/21 1645)  Chlorhexidine Gluconate Cloth 2 % PADS 6 each (has no administration in time range)  ceFAZolin (ANCEF) IVPB 2g/100 mL premix (has no administration in time range)  ondansetron (ZOFRAN) injection 4 mg (4 mg Intravenous Given 07/15/21 1548)  iohexol (OMNIPAQUE) 300 MG/ML solution 100 mL (100 mLs Intravenous Contrast Given 07/15/21 1658)    ED Course  I have reviewed the triage vital signs and the nursing notes.  Pertinent labs & imaging results that were available during my care of the patient were reviewed by me and considered in my medical decision making (see chart for details).  Clinical Course as of 07/16/21 1201  Wed  Jul 15, 2021  1700 Case discussed with general surgery, Dr. Constance Haw who will see the patient.  He is currently in CT [EW]    Clinical Course User Index [EW] Daleen Bo, MD   MDM Rules/Calculators/A&P  Patient Vitals for the past 24 hrs:  BP Temp Temp src Pulse Resp SpO2 Height Weight  07/15/21 1615 (!) 159/95 -- -- 68 20 100 % -- --  07/15/21 1507 -- -- -- -- -- -- '6\' 1"'$  (1.854 m) 115.7 kg  07/15/21 1500 (!) 159/110 98.7 F (37.1 C) Oral 83 (!) 22 100 % -- --    5:25 PM-  reevaluation with update and discussion. After initial assessment and treatment, an updated evaluation reveals he remains uncomfortable.  He has been seen by general surgery and they are arranging interventions/operative management. Daleen Bo   Medical Decision Making:  This patient is presenting for evaluation of tender umbilical hernia mass, which does require a range of treatment options, and is a complaint that involves a moderate risk of morbidity and mortality. The differential diagnoses include incarcerated hernia, nonreducible hernia, intra-abdominal complication. I decided to review old records, and in summary middle-aged male, presenting with abdominal pain and tender hernia, hernia present for years.  He states it never reduces.  I did not require additional historical information from anyone.  Clinical Laboratory Tests Ordered, included CBC, Metabolic panel, and viral panel . Review indicates no except sodium low, glucose high. Radiologic Tests Ordered, included CT abdomen pelvis.  I independently Visualized: Radiographic images, which show incarcerated small bowel hernia with upstream partial obstruction and findings worrisome for early strangulation    Critical Interventions-clinical evaluation, laboratory testing, radiography, IV fluids and narcotic analgesia, observation and reassessment  After These Interventions, the Patient was reevaluated and was found with  incarcerated hernia, worrisome for ischemia, and small bowel obstruction.  He requires operative management.  CRITICAL CARE- Yes Performed by: Daleen Bo  Nursing Notes Reviewed/ Care Coordinated Applicable Imaging Reviewed Interpretation of Laboratory Data incorporated into ED treatment   Plan admit to general surgery for operative management.   Final Clinical Impression(s) / ED Diagnoses Final diagnoses:  Strangulated ventral hernia    Rx / DC Orders ED Discharge Orders     None            Daleen Bo, MD 07/16/21 1202

## 2021-07-15 NOTE — Transfer of Care (Signed)
Immediate Anesthesia Transfer of Care Note  Patient: Zachary [name]  Procedure(s) Performed: HERNIA REPAIR VENTRAL ADULT, incarcerated  Patient Location: PACU  Anesthesia Type:General  Level of Consciousness: awake and drowsy  Airway & Oxygen Therapy: Patient Spontanous Breathing  Post-op Assessment: Report given to RN and Post -op Vital signs reviewed and stable  Post vital signs: Reviewed and stable  Last Vitals:  Vitals Value Taken Time  BP    Temp    Pulse    Resp    SpO2      Last Pain:  Vitals:   07/15/21 1719  TempSrc:   PainSc: 10-Worst pain ever         Complications: No notable events documented.

## 2021-07-15 NOTE — ED Triage Notes (Signed)
Pt c/o severe abdominal pain due to hernia; pt has large bump to area beside umbilicus

## 2021-07-15 NOTE — ED Notes (Signed)
Patient transported to CT 

## 2021-07-15 NOTE — Progress Notes (Signed)
[facility] History and Physical  Reason for Referral: Incarcerated Ventral hernia Referring Physician: Dr. Eulis Foster  Chief Complaint   Abdominal Pain     Zachary [name] is a 50 y.o. male.  HPI: [name] is a 50 yo with a chronically incarcerated ventral hernia that he says has been out for years and does not normally cause him issues and is soft. He reports that today the hernia became hard, larger, and is quite painful. He has received multiple doses of pain medication in the ED with resolution of the pain. Dr. Eulis Foster reported the hernia to be hard and incarcerated, and sent him to CT. I saw the patient in CT and he did have a hernia with omentum and small bowel with a knuckle of bowel which will eventually cause an obstruction.  He denies any vomiting or nausea. He is in pain. He came to the ED 1 month ago with facial numbness, CT and MRI were negative for stroke and ECHO was wnl. Neurology thought he either had a TIA or a atypical migraine. He was sent home on aspirin. He denies any cardiac issues. He lifts at work. He had an open appendectomy in the past.    Past Medical History:  Diagnosis Date   Chicken pox    Frequent headaches    Psoriasis     Past Surgical History:  Procedure Laterality Date   APPENDECTOMY     BACK SURGERY     FOOT SURGERY      Family History  Problem Relation Age of Onset   Lupus Mother    Prostate cancer Father    Bladder Cancer Father    Diabetes Father    Colon cancer Maternal Grandmother    Diabetes Maternal Grandmother     Social History   Tobacco Use   Smoking status: Former    Packs/day: 1.50    Years: 30.00    Pack years: 45.00    Types: Cigarettes    Quit date: 06/03/2011    Years since quitting: 10.1   Smokeless tobacco: Never  Vaping Use   Vaping Use: Never used  Substance Use Topics   Alcohol use: No   Drug use: Yes    Types: Marijuana    Comment: last used today 05/28/20    Medications: I have reviewed  the patient's current medications. Prior to Admission: (Not in a hospital admission) Scheduled:  Chlorhexidine Gluconate Cloth  6 each Topical Once   Continuous:  sodium chloride 125 mL/hr at 07/15/21 1555   [START ON 07/16/2021]  ceFAZolin (ANCEF) IV     BX:[id number] (SUBLIMAZE) injection No Known Allergies    ROS:  A comprehensive review of systems was negative except for: Gastrointestinal: positive for abdominal pain  Blood pressure (!) 159/95, pulse 68, temperature 98.7 F (37.1 C), temperature source Oral, resp. rate 20, height '6\' 1"'$  (1.854 m), weight 115.7 kg, SpO2 100 %. Physical Exam Vitals reviewed.  Constitutional:      Appearance: He is well-developed.  HENT:     Head: Normocephalic.  Eyes:     Extraocular Movements: Extraocular movements intact.  Cardiovascular:     Rate and Rhythm: Normal rate.  Pulmonary:     Effort: Pulmonary effort is normal.  Abdominal:     General: There is distension.     Tenderness: There is abdominal tenderness.     Hernia: A hernia is present. Hernia is present in the ventral area.     Comments: Incarcerated ventral hernia  Skin:    General: Skin is warm.     Comments: Total body heat rash on arms and abdomen, chest  Neurological:     General: No focal deficit present.     Mental Status: He is alert and oriented to person, place, and time.  Psychiatric:        Mood and Affect: Mood normal.        Behavior: Behavior normal.    Results: Results for orders placed or performed during the hospital encounter of 07/15/21 (from the past 48 hour(s))  Basic metabolic panel     Status: Abnormal   Collection Time: 07/15/21  3:19 PM  Result Value Ref Range   Sodium 134 (L) 135 - 145 mmol/L   Potassium 3.7 3.5 - 5.1 mmol/L   Chloride 100 98 - 111 mmol/L   CO2 26 22 - 32 mmol/L   Glucose, Bld 102 (H) 70 - 99 mg/dL    Comment: Glucose reference range applies only to samples taken after fasting for at least 8 hours.   BUN 16 6 - 20 mg/dL    Creatinine, Ser 1.18 0.61 - 1.24 mg/dL   Calcium 9.6 8.9 - 10.3 mg/dL   GFR, Estimated >60 >60 mL/min    Comment: (NOTE) Calculated using the CKD-EPI Creatinine Equation (2021)    Anion gap 8 5 - 15    Comment: Performed at [facility], [address], [place], [place] [address]  CBC with Differential     Status: None   Collection Time: 07/15/21  3:19 PM  Result Value Ref Range   WBC 8.6 4.0 - 10.5 K/uL   RBC 5.28 4.22 - 5.81 MIL/uL   Hemoglobin 16.5 13.0 - 17.0 g/dL   HCT 46.5 39.0 - 52.0 %   MCV 88.1 80.0 - 100.0 fL   MCH 31.3 26.0 - 34.0 pg   MCHC 35.5 30.0 - 36.0 g/dL   RDW 12.1 11.5 - 15.5 %   Platelets 255 150 - 400 K/uL   nRBC 0.0 0.0 - 0.2 %   Neutrophils Relative % 71 %   Neutro Abs 6.1 1.7 - 7.7 K/uL   Lymphocytes Relative 22 %   Lymphs Abs 1.9 0.7 - 4.0 K/uL   Monocytes Relative 5 %   Monocytes Absolute 0.5 0.1 - 1.0 K/uL   Eosinophils Relative 2 %   Eosinophils Absolute 0.2 0.0 - 0.5 K/uL   Basophils Relative 0 %   Basophils Absolute 0.0 0.0 - 0.1 K/uL   Immature Granulocytes 0 %   Abs Immature Granulocytes 0.03 0.00 - 0.07 K/uL    Comment: Performed at [facility], [address], [place], [place] [address]  Hepatic function panel     Status: None   Collection Time: 07/15/21  3:26 PM  Result Value Ref Range   Total Protein 8.1 6.5 - 8.1 g/dL   Albumin 4.7 3.5 - 5.0 g/dL   AST 34 15 - 41 U/L   ALT 36 0 - 44 U/L   Alkaline Phosphatase 63 38 - 126 U/L   Total Bilirubin 1.0 0.3 - 1.2 mg/dL   Bilirubin, Direct 0.2 0.0 - 0.2 mg/dL   Indirect Bilirubin 0.8 0.3 - 0.9 mg/dL    Comment: Performed at [facility], [address] [place], [place] [address]  Lipase, blood     Status: None   Collection Time: 07/15/21  3:26 PM  Result Value Ref Range   Lipase 31 11 - 51 U/L    Comment: Performed  at [facility], [address], [place], [place] [address]  Resp Panel by RT-PCR (Flu A&B, Covid) Nasopharyngeal Swab     Status: None   Collection Time: 07/15/21  4:03 PM    Specimen: Nasopharyngeal Swab; Nasopharyngeal(NP) swabs in vial transport medium  Result Value Ref Range   SARS Coronavirus 2 by RT PCR NEGATIVE NEGATIVE    Comment: (NOTE) SARS-CoV-2 target nucleic acids are NOT DETECTED.  The SARS-CoV-2 RNA is generally detectable in upper respiratory specimens during the acute phase of infection. The lowest concentration of SARS-CoV-2 viral copies this assay can detect is 138 copies/mL. A negative result does not preclude SARS-Cov-2 infection and should not be used as the sole basis for treatment or other patient management decisions. A negative result may occur with  improper specimen collection/handling, submission of specimen other than nasopharyngeal swab, presence of viral mutation(s) within the areas targeted by this assay, and inadequate number of viral copies(<138 copies/mL). A negative result must be combined with clinical observations, patient history, and epidemiological information. The expected result is Negative.  Fact Sheet for Patients:  EntrepreneurPulse.com.au  Fact Sheet for Healthcare Providers:  IncredibleEmployment.be  This test is no t yet approved or cleared by the [place] FDA and  has been authorized for detection and/or diagnosis of SARS-CoV-2 by FDA under an Emergency Use Authorization (EUA). This EUA will remain  in effect (meaning this test can be used) for the duration of the COVID-19 declaration under Section 564(b)(1) of the Act, 21 U.S.C.section 360bbb-3(b)(1), unless the authorization is terminated  or revoked sooner.       Influenza A by PCR NEGATIVE NEGATIVE   Influenza B by PCR NEGATIVE NEGATIVE    Comment: (NOTE) The Xpert Xpress SARS-CoV-2/FLU/RSV plus assay is intended as an aid in the diagnosis of influenza from Nasopharyngeal swab specimens and should not be used as a sole basis for treatment. Nasal washings and aspirates are unacceptable for Xpert Xpress  SARS-CoV-2/FLU/RSV testing.  Fact Sheet for Patients: EntrepreneurPulse.com.au  Fact Sheet for Healthcare Providers: IncredibleEmployment.be  This test is not yet approved or cleared by the [place] FDA and has been authorized for detection and/or diagnosis of SARS-CoV-2 by FDA under an Emergency Use Authorization (EUA). This EUA will remain in effect (meaning this test can be used) for the duration of the COVID-19 declaration under Section 564(b)(1) of the Act, 21 U.S.C. section 360bbb-3(b)(1), unless the authorization is terminated or revoked.  Performed at [facility], [address], [place], [place] [address]     CT Abdomen Pelvis W Contrast  Result Date: 07/15/2021 CLINICAL DATA:  Severe abdominal pain with periumbilical hernia onset this afternoon. Prior appendectomy. EXAM: CT ABDOMEN AND PELVIS WITH CONTRAST TECHNIQUE: Multidetector CT imaging of the abdomen and pelvis was performed using the standard protocol following bolus administration of intravenous contrast. CONTRAST:  118m OMNIPAQUE IOHEXOL 300 MG/ML  SOLN COMPARISON:  None. FINDINGS: Lower chest: No significant pulmonary nodules or acute consolidative airspace disease. Hepatobiliary: Normal liver size. No liver mass. Normal gallbladder with no radiopaque cholelithiasis. No biliary ductal dilatation. Pancreas: Normal, with no mass or duct dilation. Spleen: Normal size. No mass. Adrenals/Urinary Tract: Normal adrenals. No renal stones. No hydronephrosis. Simple 1.4 cm upper left renal cyst. No additional contour deforming renal lesions. Normal bladder. Stomach/Bowel: Normal non-distended stomach. Large left periumbilical ventral abdominal hernia containing a dilated (4.1 cm diameter) mid small bowel loop with abrupt caliber transition at the hernia site. Slight wall thickening of the central portion of the  herniated small bowel loop. Slight fat stranding and ill-defined fluid in the  hernia sac. A few mildly dilated proximal small bowel loops in the upper abdomen up to 3.1 cm diameter. No additional sites of small bowel wall thickening. No definite pneumatosis. Appendectomy. Normal large bowel with no diverticulosis, large bowel wall thickening or pericolonic fat stranding. Vascular/Lymphatic: Atherosclerotic nonaneurysmal abdominal aorta. No pathologically enlarged lymph nodes in the abdomen or pelvis. Reproductive: Normal size prostate. Other: No pneumoperitoneum, ascites or focal fluid collection. Musculoskeletal: No aggressive appearing focal osseous lesions. Bilateral posterior spinal fusion at L5-S1. Mild-to-moderate thoracolumbar spondylosis. IMPRESSION: 1. Large left periumbilical ventral abdominal hernia containing an incarcerated dilated small bowel loop with early mechanical small-bowel obstruction with mildly dilated upstream small bowel loops. Slight wall thickening of the central portion of the herniated small bowel loop. Slight fat stranding and ill-defined fluid in the hernia sac. No definite pneumatosis or free air. Early ischemic change in the herniated small bowel loop not excluded. Surgical consultation advised. 2. Aortic Atherosclerosis (ICD10-I70.0). Electronically Signed   By: Ilona Sorrel M.D.   On: 07/15/2021 17:26     Assessment & Plan:  VINT MASLEN is a 50 y.o. male with an incarcerated hernia with small bowel from his appendectomy. I could not reduce the hernia. We discussed surgery and risk of bleeding, infection, need for bowel resection, use of mesh, recurrence, and post operative restrictions.   -COVID negative  -OR now for hernia   All questions were answered to the satisfaction of the patient.    [name] 07/15/2021, 5:34 PM

## 2021-07-15 NOTE — Anesthesia Procedure Notes (Signed)
Procedure Name: Intubation Date/Time: 07/15/2021 6:45 PM Performed by: Louann Sjogren, MD Pre-anesthesia Checklist: Patient identified, Emergency Drugs available, Suction available and Patient being monitored Patient Re-evaluated:Patient Re-evaluated prior to induction Oxygen Delivery Method: Circle system utilized Preoxygenation: Pre-oxygenation with 100% oxygen Induction Type: IV induction Ventilation: Mask ventilation without difficulty Laryngoscope Size: Mac and 3 Grade View: Grade I Tube type: Oral Tube size: 7.0 mm Number of attempts: 2 Airway Equipment and Method: Stylet and Oral airway Placement Confirmation: ETT inserted through vocal cords under direct vision, positive ETCO2 and breath sounds checked- equal and bilateral Tube secured with: Tape Dental Injury: Teeth and Oropharynx as per pre-operative assessment  Comments: Airway easy - first attempt before fully relaxed.

## 2021-07-15 NOTE — Anesthesia Preprocedure Evaluation (Signed)
Anesthesia Evaluation  Patient identified by MRN, date of birth, ID band Patient awake    Reviewed: Allergy & Precautions, H&P , NPO status , Patient's Chart, lab work & pertinent test results, reviewed documented beta blocker date and time   Airway Mallampati: II  TM Distance: >3 FB Neck ROM: full    Dental no notable dental hx.    Pulmonary neg pulmonary ROS, former smoker,    Pulmonary exam normal breath sounds clear to auscultation       Cardiovascular Exercise Tolerance: Good negative cardio ROS   Rhythm:regular Rate:Normal     Neuro/Psych  Headaches, TIAnegative psych ROS   GI/Hepatic negative GI ROS, Neg liver ROS,   Endo/Other  negative endocrine ROS  Renal/GU negative Renal ROS  negative genitourinary   Musculoskeletal   Abdominal   Peds  Hematology negative hematology ROS (+)   Anesthesia Other Findings   Reproductive/Obstetrics negative OB ROS                             Anesthesia Physical Anesthesia Plan  ASA: 2 and emergent  Anesthesia Plan: General, General ETT, Rapid Sequence and Cricoid Pressure   Post-op Pain Management:    Induction:   PONV Risk Score and Plan: Ondansetron  Airway Management Planned:   Additional Equipment:   Intra-op Plan:   Post-operative Plan:   Informed Consent: I have reviewed the patients History and Physical, chart, labs and discussed the procedure including the risks, benefits and alternatives for the proposed anesthesia with the patient or authorized representative who has indicated his/her understanding and acceptance.     Dental Advisory Given  Plan Discussed with: CRNA  Anesthesia Plan Comments:         Anesthesia Quick Evaluation

## 2021-07-15 NOTE — Progress Notes (Signed)
[facility]  Wife updated. Incarcerated hernia with ischemic bowel but became viable with reduction.   Clear diet Binder PRN for pain Will monitor in hospital until eating and having Bms  Curlene Labrum, MD [facility] [address] [place], [place] [address] [phone] (office)

## 2021-07-15 NOTE — Op Note (Signed)
[facility] Operative Note  07/15/21  Preoperative Diagnosis: Incarcerated incisional hernia    Postoperative Diagnosis: Incarcerated incisional hernia with ischemia small bowel, viable after reduction    Procedure(s) Performed:  Ventral hernia repair with mesh (Ventralex St Hernia Patch 8cm)   Surgeon: Lanell Matar. Constance Haw, MD   Assistants: No qualified resident was available    Anesthesia: General endotracheal   Anesthesiologist: Louann Sjogren, MD    Specimens: Omentum and hernia    Estimated Blood Loss: Minimal   Blood Replacement: None    Complications: None   Wound Class: Clean    Operative Indications: Mr. Postiglione is a 50 yo with an incarcerated incisional hernia who has had it for years but it worsened today. He had a loop of small bowel in the area on CT and was not reducible. I discussed repair with mesh and risk of need a bowel resection and primary repair. Discussed risk of bleeding, infection, recurrence.   Findings: Incarcerated loop of small bowel and omentum with ischemic bowel that was viable after reduction   Procedure: The patient was taken to the operating room and placed supine. General endotracheal anesthesia was induced. Intravenous antibiotics were  administered per protocol.  A foley was placed. The abdomen was prepared and draped in the usual sterile fashion.   The incisional hernia was noted to be incarcearted and measured about 12cm at the skin but the fascia defect was about 3cm on CT. An incision was made around the umbilicus superior and infection, and carried down through the subcutaneous tissue with electrocautery.  Dissection was performed down to the level of the fascia, exposing the hernia sac.  The hernia sac was opened with care with scissors. An ischemic loop of small bowel was noted. The excess hernia sac was resected with electrocautery and I opened the fascia inferiorly to help with reduction of the ischemic bowel. The  bowel became pink and viable. I noted a serosal injury and I think it occurred with scissors but I did use cautery to excess a portion of the sac, given this I oversewed the small < 0.5cm area with two layers of Lembert 3-0 silk sutures ensuring to not narrow the lumen. A second little serosal injury was oversewn with a single layer of silk 3-0 lembert sutures. A portion of the omentum that was adherent to the hernia sac was resected with the sac and sent to pathology.  A finger was ran on the underlying peritoneum and this was cleared. A small bowel loop was adherent to the inferior incision and this was taken down with scissors.  A 8 cm Ventralex St Hernia Patch was placed and secured with 0 Ethibond sutures ensuring that it was against the peritoneal cavity.  The hernia defect was then closed with 0 Ethibond suture in an interrupted fashion over the patch.  The umbilical stalk was still tacked down to the fascia as this was around the umbilicus in the prior incision from his appendectomy.  The large hernia sac left a large cavity in the subcutaneous tissue on the left side of the abdomen. The fat was tacked together to close the space with interrupted 2-0 Vicryl suture. The incision was closed deep with 3-0 Vicryl interrupted sutures.  Hemostasis was confirmed. Exparel was injected. The skin was closed with staples and a honeycomb dressing was placed.  The foley and OG were removed.   All counts were correct at the end of the case. The patient was awakened from anesthesia and  extubated without complication.  The patient went to the PACU in stable condition.  Curlene Labrum, MD [facility] [address] [place], [place] [address] [phone] (office)

## 2021-07-15 NOTE — Anesthesia Postprocedure Evaluation (Signed)
Anesthesia Post Note  Patient: Zachary [name]  Procedure(s) Performed: HERNIA REPAIR VENTRAL ADULT, incarcerated  Patient location during evaluation: PACU Anesthesia Type: General Level of consciousness: awake and alert Pain management: pain level controlled Vital Signs Assessment: post-procedure vital signs reviewed and stable Respiratory status: spontaneous breathing, nonlabored ventilation, respiratory function stable and patient connected to nasal cannula oxygen Cardiovascular status: blood pressure returned to baseline and stable Postop Assessment: no apparent nausea or vomiting Anesthetic complications: no   No notable events documented.   Last Vitals:  Vitals:   07/15/21 1500 07/15/21 1615  BP: (!) 159/110 (!) 159/95  Pulse: 83 68  Resp: (!) 22 20  Temp: 37.1 C   SpO2: 100% 100%    Last Pain:  Vitals:   07/15/21 1719  TempSrc:   PainSc: 10-Worst pain ever                 Louann Sjogren

## 2021-07-16 ENCOUNTER — Encounter (HOSPITAL_COMMUNITY): Payer: Self-pay | Admitting: General Surgery

## 2021-07-16 LAB — CBC WITH DIFFERENTIAL/PLATELET
Abs Immature Granulocytes: 0.09 10*3/uL — ABNORMAL HIGH (ref 0.00–0.07)
Basophils Absolute: 0 10*3/uL (ref 0.0–0.1)
Basophils Relative: 0 %
Eosinophils Absolute: 0.2 10*3/uL (ref 0.0–0.5)
Eosinophils Relative: 2 %
HCT: 41.5 % (ref 39.0–52.0)
Hemoglobin: 14 g/dL (ref 13.0–17.0)
Immature Granulocytes: 1 %
Lymphocytes Relative: 15 %
Lymphs Abs: 2.2 10*3/uL (ref 0.7–4.0)
MCH: 30.6 pg (ref 26.0–34.0)
MCHC: 33.7 g/dL (ref 30.0–36.0)
MCV: 90.6 fL (ref 80.0–100.0)
Monocytes Absolute: 0.9 10*3/uL (ref 0.1–1.0)
Monocytes Relative: 6 %
Neutro Abs: 11 10*3/uL — ABNORMAL HIGH (ref 1.7–7.7)
Neutrophils Relative %: 76 %
Platelets: 205 10*3/uL (ref 150–400)
RBC: 4.58 MIL/uL (ref 4.22–5.81)
RDW: 12.4 % (ref 11.5–15.5)
WBC: 14.4 10*3/uL — ABNORMAL HIGH (ref 4.0–10.5)
nRBC: 0 % (ref 0.0–0.2)

## 2021-07-16 LAB — BASIC METABOLIC PANEL
Anion gap: 5 (ref 5–15)
BUN: 13 mg/dL (ref 6–20)
CO2: 27 mmol/L (ref 22–32)
Calcium: 8.2 mg/dL — ABNORMAL LOW (ref 8.9–10.3)
Chloride: 105 mmol/L (ref 98–111)
Creatinine, Ser: 0.96 mg/dL (ref 0.61–1.24)
GFR, Estimated: >60 mL/min (ref >60)
Glucose, Bld: 107 mg/dL — ABNORMAL HIGH (ref 70–99)
Potassium: 3.6 mmol/L (ref 3.5–5.1)
Sodium: 137 mmol/L (ref 135–145)

## 2021-07-16 LAB — MAGNESIUM: Magnesium: 1.8 mg/dL (ref 1.7–2.4)

## 2021-07-16 LAB — PHOSPHORUS: Phosphorus: 3.4 mg/dL (ref 2.5–4.6)

## 2021-07-16 NOTE — Progress Notes (Signed)
[facility] Progress Note  1 Day Post-Op  Subjective: Doing fair but having pain. Tolerating some clears. No Bms.   Objective: Vital signs in last 24 hours: Temp:  [98.1 F (36.7 C)-98.5 F (36.9 C)] 98.5 F (36.9 C) (07/28 1311) Pulse Rate:  [77-88] 79 (07/28 1311) Resp:  [11-18] 16 (07/28 1311) BP: (124-150)/(77-95) 126/77 (07/28 1311) SpO2:  [90 %-98 %] 95 % (07/28 1311)    Intake/Output from previous day: 07/27 0701 - 07/28 0700 In: 1385.7 [I.V.:1285.7; IV Piggyback:100] Out: 975 [Urine:970; Blood:5] Intake/Output this shift: Total I/O In: 1273 [P.O.:470; I.V.:803] Out: 1200 [Urine:1200]  General appearance: alert, cooperative, and no distress Resp: normal work of breathing GI: soft, mildly tender, honeycomb with staining, binder with staining   Lab Results:  Recent Labs    07/15/21 1519 07/16/21 0339  WBC 8.6 14.4*  HGB 16.5 14.0  HCT 46.5 41.5  PLT 255 205   BMET Recent Labs    07/15/21 1519 07/16/21 0339  NA 134* 137  K 3.7 3.6  CL 100 105  CO2 26 27  GLUCOSE 102* 107*  BUN 16 13  CREATININE 1.18 0.96  CALCIUM 9.6 8.2*   PT/INR No results for input(s): LABPROT, INR in the last 72 hours.  Studies/Results: CT Abdomen Pelvis W Contrast  Result Date: 07/15/2021 CLINICAL DATA:  Severe abdominal pain with periumbilical hernia onset this afternoon. Prior appendectomy. EXAM: CT ABDOMEN AND PELVIS WITH CONTRAST TECHNIQUE: Multidetector CT imaging of the abdomen and pelvis was performed using the standard protocol following bolus administration of intravenous contrast. CONTRAST:  16m OMNIPAQUE IOHEXOL 300 MG/ML  SOLN COMPARISON:  None. FINDINGS: Lower chest: No significant pulmonary nodules or acute consolidative airspace disease. Hepatobiliary: Normal liver size. No liver mass. Normal gallbladder with no radiopaque cholelithiasis. No biliary ductal dilatation. Pancreas: Normal, with no mass or duct dilation. Spleen: Normal size. No mass.  Adrenals/Urinary Tract: Normal adrenals. No renal stones. No hydronephrosis. Simple 1.4 cm upper left renal cyst. No additional contour deforming renal lesions. Normal bladder. Stomach/Bowel: Normal non-distended stomach. Large left periumbilical ventral abdominal hernia containing a dilated (4.1 cm diameter) mid small bowel loop with abrupt caliber transition at the hernia site. Slight wall thickening of the central portion of the herniated small bowel loop. Slight fat stranding and ill-defined fluid in the hernia sac. A few mildly dilated proximal small bowel loops in the upper abdomen up to 3.1 cm diameter. No additional sites of small bowel wall thickening. No definite pneumatosis. Appendectomy. Normal large bowel with no diverticulosis, large bowel wall thickening or pericolonic fat stranding. Vascular/Lymphatic: Atherosclerotic nonaneurysmal abdominal aorta. No pathologically enlarged lymph nodes in the abdomen or pelvis. Reproductive: Normal size prostate. Other: No pneumoperitoneum, ascites or focal fluid collection. Musculoskeletal: No aggressive appearing focal osseous lesions. Bilateral posterior spinal fusion at L5-S1. Mild-to-moderate thoracolumbar spondylosis. IMPRESSION: 1. Large left periumbilical ventral abdominal hernia containing an incarcerated dilated small bowel loop with early mechanical small-bowel obstruction with mildly dilated upstream small bowel loops. Slight wall thickening of the central portion of the herniated small bowel loop. Slight fat stranding and ill-defined fluid in the hernia sac. No definite pneumatosis or free air. Early ischemic change in the herniated small bowel loop not excluded. Surgical consultation advised. 2. Aortic Atherosclerosis (ICD10-I70.0). Electronically Signed   By: JIlona SorrelM.D.   On: 07/15/2021 17:26    Anti-infectives: Anti-infectives (From admission, onward)    Start     Dose/Rate Route Frequency Ordered Stop   07/16/21 0600  ceFAZolin (  ANCEF)  IVPB 2g/100 mL premix        2 g 200 mL/hr over 30 Minutes Intravenous On call to O.R. 07/15/21 1717 07/15/21 1847   07/15/21 1831  ceFAZolin (ANCEF) 2-4 GM/100ML-% IVPB       Note to Pharmacy: Lear Ng   : cabinet override      07/15/21 1831 07/15/21 1847       Assessment/Plan: Mr. Behler is a 50 yo s/p ventral hernia repair with mesh for an incarcerated hernia. Doing fair. Will need to ensure has BM and diet before dc given incarceration.  PRN for pain IS, OOB Clear diet, await BM Binder replaced due to saturation CBC in AM Holding lovenox as H&H down fro 16.5/46 to 14/41  Ambulate for DVT prophylaxis, SCDs     LOS: 0 days    [name] 07/16/2021

## 2021-07-16 NOTE — Progress Notes (Signed)
Abdominal dressing checked.  Only bottom abdominal paid stained with blood, with minimal amount.  Patient stable, no complaints of pain.  Vitals stable during shift.

## 2021-07-17 DIAGNOSIS — Z20822 Contact with and (suspected) exposure to covid-19: Secondary | ICD-10-CM | POA: Diagnosis present

## 2021-07-17 DIAGNOSIS — Z833 Family history of diabetes mellitus: Secondary | ICD-10-CM | POA: Diagnosis not present

## 2021-07-17 DIAGNOSIS — K559 Vascular disorder of intestine, unspecified: Secondary | ICD-10-CM | POA: Diagnosis present

## 2021-07-17 DIAGNOSIS — Z8042 Family history of malignant neoplasm of prostate: Secondary | ICD-10-CM | POA: Diagnosis not present

## 2021-07-17 DIAGNOSIS — K430 Incisional hernia with obstruction, without gangrene: Secondary | ICD-10-CM | POA: Diagnosis present

## 2021-07-17 DIAGNOSIS — K436 Other and unspecified ventral hernia with obstruction, without gangrene: Secondary | ICD-10-CM | POA: Diagnosis present

## 2021-07-17 DIAGNOSIS — Z832 Family history of diseases of the blood and blood-forming organs and certain disorders involving the immune mechanism: Secondary | ICD-10-CM | POA: Diagnosis not present

## 2021-07-17 DIAGNOSIS — Z87891 Personal history of nicotine dependence: Secondary | ICD-10-CM | POA: Diagnosis not present

## 2021-07-17 LAB — CBC WITH DIFFERENTIAL/PLATELET
Abs Immature Granulocytes: 0.06 10*3/uL (ref 0.00–0.07)
Basophils Absolute: 0 10*3/uL (ref 0.0–0.1)
Basophils Relative: 0 %
Eosinophils Absolute: 0.3 10*3/uL (ref 0.0–0.5)
Eosinophils Relative: 2 %
HCT: 41 % (ref 39.0–52.0)
Hemoglobin: 13.7 g/dL (ref 13.0–17.0)
Immature Granulocytes: 0 %
Lymphocytes Relative: 17 %
Lymphs Abs: 2.4 10*3/uL (ref 0.7–4.0)
MCH: 30.6 pg (ref 26.0–34.0)
MCHC: 33.4 g/dL (ref 30.0–36.0)
MCV: 91.7 fL (ref 80.0–100.0)
Monocytes Absolute: 1 10*3/uL (ref 0.1–1.0)
Monocytes Relative: 8 %
Neutro Abs: 9.9 10*3/uL — ABNORMAL HIGH (ref 1.7–7.7)
Neutrophils Relative %: 73 %
Platelets: 194 10*3/uL (ref 150–400)
RBC: 4.47 MIL/uL (ref 4.22–5.81)
RDW: 12.1 % (ref 11.5–15.5)
WBC: 13.7 10*3/uL — ABNORMAL HIGH (ref 4.0–10.5)
nRBC: 0 % (ref 0.0–0.2)

## 2021-07-17 LAB — SURGICAL PATHOLOGY

## 2021-07-17 MED ORDER — ENOXAPARIN SODIUM 60 MG/0.6ML IJ SOSY
60.0000 mg | PREFILLED_SYRINGE | INTRAMUSCULAR | Status: DC
  Administered 2021-07-17 – 2021-07-18 (×2): 60 mg via SUBCUTANEOUS
  Filled 2021-07-17 (×2): qty 0.6

## 2021-07-17 NOTE — H&P (Signed)
Entered as progress note initially. Redone as a H&P.   [facility] History and Physical   Reason for Referral: Incarcerated Ventral hernia Referring Physician: Dr. Eulis Foster   Chief Complaint   Abdominal Pain        Zachary [name] is a 50 y.o. male. HPI: Ms. 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 He reports that today the hernia became hard, larger, and is quite painful. He has received multiple doses of pain medication in the ED with resolution of the pain. Dr. Eulis Foster reported the hernia to be hard and incarcerated, and sent him to CT. I saw the patient in CT and he did have a hernia with omentum and small bowel with a knuckle of bowel which will eventually cause an obstruction.   He denies any vomiting or nausea. He is in pain. He came to the ED 1 month ago with facial numbness, CT and MRI were negative for stroke and ECHO was wnl. Neurology thought he either had a TIA or a atypical migraine. He was sent home on aspirin. He denies any cardiac issues. He lifts at work. He had an open appendectomy in the past.          Past Medical History:  Diagnosis Date   Chicken pox     Frequent headaches     Psoriasis             Past Surgical History:  Procedure Laterality Date   APPENDECTOMY       BACK SURGERY       FOOT SURGERY               Family History  Problem Relation Age of Onset   Lupus Mother     Prostate cancer Father     Bladder Cancer Father     Diabetes Father     Colon cancer Maternal Grandmother     Diabetes Maternal Grandmother        Social History         Tobacco Use   Smoking status: Former      Packs/day: 1.50      Years: 30.00      Pack years: 45.00      Types: Cigarettes      Quit date: 06/03/2011      Years since quitting: 10.1   Smokeless tobacco: Never  Vaping Use   Vaping Use: Never used  Substance Use Topics    Alcohol use: No   Drug use: Yes      Types: Marijuana      Comment: last used today 05/28/20      Medications: I have reviewed the patient's current medications. Prior to Admission: (Not in a hospital admission) Scheduled:  Chlorhexidine Gluconate Cloth  6 each Topical Once    Continuous:  sodium chloride 125 mL/hr at 07/15/21 1555   [START ON 07/16/2021]  ceFAZolin (ANCEF) IV      [id number] (SUBLIMAZE) injection No Known Allergies       ROS:  A comprehensive review of systems was negative except for: Gastrointestinal: positive for abdominal pain   Blood pressure (!) 159/95, pulse 68, temperature 98.7 F (37.1 C), temperature source Oral, resp. rate 20, height '6\' 1"'$  (1.854 m), weight 115.7 kg, SpO2 100 %. Physical Exam Vitals reviewed. Constitutional:      Appearance: He is well-developed. HENT:  Head: Normocephalic. Eyes:    Extraocular Movements: Extraocular movements intact. Cardiovascular:    Rate and Rhythm: Normal rate. Pulmonary:    Effort: Pulmonary effort is normal. Abdominal:    General: There is distension.    Tenderness: There is abdominal tenderness.    Hernia: A hernia is present. Hernia is present in the ventral area.    Comments: Incarcerated ventral hernia  Skin:    General: Skin is warm.    Comments: Total body heat rash on arms and abdomen, chest  Neurological:    General: No focal deficit present.    Mental Status: He is alert and oriented to person, place, and time. Psychiatric:        Mood and Affect: Mood normal.        Behavior: Behavior normal.     Results: Lab Results Last 48 Hours        Results for orders placed or performed during the hospital encounter of 07/15/21 (from the past 48 hour(s))  Basic metabolic panel     Status: Abnormal    Collection Time: 07/15/21  3:19 PM  Result Value Ref Range    Sodium 134 (L) 135 - 145 mmol/L    Potassium 3.7 3.5 - 5.1 mmol/L    Chloride 100 98 - 111 mmol/L    CO2 26 22 - 32 mmol/L     Glucose, Bld 102 (H) 70 - 99 mg/dL      Comment: Glucose reference range applies only to samples taken after fasting for at least 8 hours.    BUN 16 6 - 20 mg/dL    Creatinine, Ser 1.18 0.61 - 1.24 mg/dL    Calcium 9.6 8.9 - 10.3 mg/dL    GFR, Estimated >60 >60 mL/min      Comment: (NOTE) Calculated using the CKD-EPI Creatinine Equation (2021)      Anion gap 8 5 - 15      Comment: Performed at [facility], [address], [place], [place] [address]  CBC with Differential     Status: None    Collection Time: 07/15/21  3:19 PM  Result Value Ref Range    WBC 8.6 4.0 - 10.5 K/uL    RBC 5.28 4.22 - 5.81 MIL/uL    Hemoglobin 16.5 13.0 - 17.0 g/dL    HCT 46.5 39.0 - 52.0 %    MCV 88.1 80.0 - 100.0 fL    MCH 31.3 26.0 - 34.0 pg    MCHC 35.5 30.0 - 36.0 g/dL    RDW 12.1 11.5 - 15.5 %    Platelets 255 150 - 400 K/uL    nRBC 0.0 0.0 - 0.2 %    Neutrophils Relative % 71 %    Neutro Abs 6.1 1.7 - 7.7 K/uL    Lymphocytes Relative 22 %    Lymphs Abs 1.9 0.7 - 4.0 K/uL    Monocytes Relative 5 %    Monocytes Absolute 0.5 0.1 - 1.0 K/uL    Eosinophils Relative 2 %    Eosinophils Absolute 0.2 0.0 - 0.5 K/uL    Basophils Relative 0 %    Basophils Absolute 0.0 0.0 - 0.1 K/uL    Immature Granulocytes 0 %    Abs Immature Granulocytes 0.03 0.00 - 0.07 K/uL      Comment: Performed at [facility], [address] [place], [place] [address]  Hepatic function panel     Status: None    Collection Time: 07/15/21  3:26 PM  Result Value  Ref Range    Total Protein 8.1 6.5 - 8.1 g/dL    Albumin 4.7 3.5 - 5.0 g/dL    AST 34 15 - 41 U/L    ALT 36 0 - 44 U/L    Alkaline Phosphatase 63 38 - 126 U/L    Total Bilirubin 1.0 0.3 - 1.2 mg/dL    Bilirubin, Direct 0.2 0.0 - 0.2 mg/dL    Indirect Bilirubin 0.8 0.3 - 0.9 mg/dL      Comment: Performed at [facility], [address], [place], [place] [address]  Lipase, blood     Status: None    Collection Time: 07/15/21  3:26 PM  Result Value Ref Range    Lipase  31 11 - 51 U/L      Comment: Performed at [facility], [address], [place], [place] [address]  Resp Panel by RT-PCR (Flu A&B, Covid) Nasopharyngeal Swab     Status: None    Collection Time: 07/15/21  4:03 PM    Specimen: Nasopharyngeal Swab; Nasopharyngeal(NP) swabs in vial transport medium  Result Value Ref Range    SARS Coronavirus 2 by RT PCR NEGATIVE NEGATIVE      Comment: (NOTE) SARS-CoV-2 target nucleic acids are NOT DETECTED.   The SARS-CoV-2 RNA is generally detectable in upper respiratory specimens during the acute phase of infection. The lowest concentration of SARS-CoV-2 viral copies this assay can detect is 138 copies/mL. A negative result does not preclude SARS-Cov-2 infection and should not be used as the sole basis for treatment or other patient management decisions. A negative result may occur with improper specimen collection/handling, submission of specimen other than nasopharyngeal swab, presence of viral mutation(s) within the areas targeted by this assay, and inadequate number of viral copies(<138 copies/mL). A negative result must be combined with clinical observations, patient history, and epidemiological information. The expected result is Negative.   Fact Sheet for Patients: EntrepreneurPulse.com.au   Fact Sheet for Healthcare Providers: IncredibleEmployment.be   This test is no t yet approved or cleared by the [place] FDA and has been authorized for detection and/or diagnosis of SARS-CoV-2 by FDA under an Emergency Use Authorization (EUA). This EUA will remain in effect (meaning this test can be used) for the duration of the COVID-19 declaration under Section 564(b)(1) of the Act, 21 U.S.C.section 360bbb-3(b)(1), unless the authorization is terminated or revoked sooner.          Influenza A by PCR NEGATIVE NEGATIVE    Influenza B by PCR NEGATIVE NEGATIVE      Comment: (NOTE) The Xpert Xpress  SARS-CoV-2/FLU/RSV plus assay is intended as an aid in the diagnosis of influenza from Nasopharyngeal swab specimens and should not be used as a sole basis for treatment. Nasal washings and aspirates are unacceptable for Xpert Xpress SARS-CoV-2/FLU/RSV testing.   Fact Sheet for Patients: EntrepreneurPulse.com.au   Fact Sheet for Healthcare Providers: IncredibleEmployment.be   This test is not yet approved or cleared by the [place] FDA and has been authorized for detection and/or diagnosis of SARS-CoV-2 by FDA under an Emergency Use Authorization (EUA). This EUA will remain in effect (meaning this test can be used) for the duration of the COVID-19 declaration under Section 564(b)(1) of the Act, 21 U.S.C. section 360bbb-3(b)(1), unless the authorization is terminated or revoked.   Performed at [facility], [address], [place],  [address]           Imaging Results (Last 48 hours)  CT Abdomen Pelvis W Contrast  Result Date: 07/15/2021 CLINICAL DATA:  Severe abdominal pain with periumbilical hernia onset this afternoon. Prior appendectomy. EXAM: CT ABDOMEN AND PELVIS WITH CONTRAST TECHNIQUE: Multidetector CT imaging of the abdomen and pelvis was performed using the standard protocol following bolus administration of intravenous contrast. CONTRAST:  178m OMNIPAQUE IOHEXOL 300 MG/ML  SOLN COMPARISON:  None. FINDINGS: Lower chest: No significant pulmonary nodules or acute consolidative airspace disease. Hepatobiliary: Normal liver size. No liver mass. Normal gallbladder with no radiopaque cholelithiasis. No biliary ductal dilatation. Pancreas: Normal, with no mass or duct dilation. Spleen: Normal size. No mass. Adrenals/Urinary Tract: Normal adrenals. No renal stones. No hydronephrosis. Simple 1.4 cm upper left renal cyst. No additional contour deforming renal lesions. Normal bladder. Stomach/Bowel: Normal non-distended stomach. Large left  periumbilical ventral abdominal hernia containing a dilated (4.1 cm diameter) mid small bowel loop with abrupt caliber transition at the hernia site. Slight wall thickening of the central portion of the herniated small bowel loop. Slight fat stranding and ill-defined fluid in the hernia sac. A few mildly dilated proximal small bowel loops in the upper abdomen up to 3.1 cm diameter. No additional sites of small bowel wall thickening. No definite pneumatosis. Appendectomy. Normal large bowel with no diverticulosis, large bowel wall thickening or pericolonic fat stranding. Vascular/Lymphatic: Atherosclerotic nonaneurysmal abdominal aorta. No pathologically enlarged lymph nodes in the abdomen or pelvis. Reproductive: Normal size prostate. Other: No pneumoperitoneum, ascites or focal fluid collection. Musculoskeletal: No aggressive appearing focal osseous lesions. Bilateral posterior spinal fusion at L5-S1. Mild-to-moderate thoracolumbar spondylosis. IMPRESSION: 1. Large left periumbilical ventral abdominal hernia containing an incarcerated dilated small bowel loop with early mechanical small-bowel obstruction with mildly dilated upstream small bowel loops. Slight wall thickening of the central portion of the herniated small bowel loop. Slight fat stranding and ill-defined fluid in the hernia sac. No definite pneumatosis or free air. Early ischemic change in the herniated small bowel loop not excluded. Surgical consultation advised. 2. Aortic Atherosclerosis (ICD10-I70.0). Electronically Signed   By: JIlona SorrelM.D.   On: 07/15/2021 17:26         Assessment & Plan:  JKHARTER REXROTHis a 50y.o. male with an incarcerated hernia with small bowel from his appendectomy. I could not reduce the hernia. We discussed surgery and risk of bleeding, infection, need for bowel resection, use of mesh, recurrence, and post operative restrictions.    -COVID negative -OR now for hernia    All questions were answered to the  satisfaction of the patient.       LVirl Cagey7/27/2022, 5:34 PM

## 2021-07-17 NOTE — Progress Notes (Signed)
[facility] Progress Note  2 Days Post-Op  Subjective: Doing well. Having flatus but no Bms. Walking.   Objective: Vital signs in last 24 hours: Temp:  [97.9 F (36.6 C)-99.1 F (37.3 C)] 97.9 F (36.6 C) (07/29 1315) Pulse Rate:  [66-85] 72 (07/29 1315) Resp:  [16-18] 18 (07/29 1315) BP: (129-136)/(74-99) 133/99 (07/29 1315) SpO2:  [93 %-97 %] 97 % (07/29 1315)    Intake/Output from previous day: 07/28 0701 - 07/29 0700 In: 1513 [P.O.:710; I.V.:803] Out: 1900 [Urine:1900] Intake/Output this shift: Total I/O In: 840 [P.O.:840] Out: -   General appearance: alert, cooperative, and no distress Resp: normal work of breathing GI: soft, mildly distended, appropriately tender, staples c/d/I and honeycomb removed due to drainage, abd placed   Lab Results:  Recent Labs    07/16/21 0339 07/17/21 0550  WBC 14.4* 13.7*  HGB 14.0 13.7  HCT 41.5 41.0  PLT 205 194   BMET Recent Labs    07/15/21 1519 07/16/21 0339  NA 134* 137  K 3.7 3.6  CL 100 105  CO2 26 27  GLUCOSE 102* 107*  BUN 16 13  CREATININE 1.18 0.96  CALCIUM 9.6 8.2*   PT/INR No results for input(s): LABPROT, INR in the last 72 hours.  Studies/Results: CT Abdomen Pelvis W Contrast  Result Date: 07/15/2021 CLINICAL DATA:  Severe abdominal pain with periumbilical hernia onset this afternoon. Prior appendectomy. EXAM: CT ABDOMEN AND PELVIS WITH CONTRAST TECHNIQUE: Multidetector CT imaging of the abdomen and pelvis was performed using the standard protocol following bolus administration of intravenous contrast. CONTRAST:  128m OMNIPAQUE IOHEXOL 300 MG/ML  SOLN COMPARISON:  None. FINDINGS: Lower chest: No significant pulmonary nodules or acute consolidative airspace disease. Hepatobiliary: Normal liver size. No liver mass. Normal gallbladder with no radiopaque cholelithiasis. No biliary ductal dilatation. Pancreas: Normal, with no mass or duct dilation. Spleen: Normal size. No mass.  Adrenals/Urinary Tract: Normal adrenals. No renal stones. No hydronephrosis. Simple 1.4 cm upper left renal cyst. No additional contour deforming renal lesions. Normal bladder. Stomach/Bowel: Normal non-distended stomach. Large left periumbilical ventral abdominal hernia containing a dilated (4.1 cm diameter) mid small bowel loop with abrupt caliber transition at the hernia site. Slight wall thickening of the central portion of the herniated small bowel loop. Slight fat stranding and ill-defined fluid in the hernia sac. A few mildly dilated proximal small bowel loops in the upper abdomen up to 3.1 cm diameter. No additional sites of small bowel wall thickening. No definite pneumatosis. Appendectomy. Normal large bowel with no diverticulosis, large bowel wall thickening or pericolonic fat stranding. Vascular/Lymphatic: Atherosclerotic nonaneurysmal abdominal aorta. No pathologically enlarged lymph nodes in the abdomen or pelvis. Reproductive: Normal size prostate. Other: No pneumoperitoneum, ascites or focal fluid collection. Musculoskeletal: No aggressive appearing focal osseous lesions. Bilateral posterior spinal fusion at L5-S1. Mild-to-moderate thoracolumbar spondylosis. IMPRESSION: 1. Large left periumbilical ventral abdominal hernia containing an incarcerated dilated small bowel loop with early mechanical small-bowel obstruction with mildly dilated upstream small bowel loops. Slight wall thickening of the central portion of the herniated small bowel loop. Slight fat stranding and ill-defined fluid in the hernia sac. No definite pneumatosis or free air. Early ischemic change in the herniated small bowel loop not excluded. Surgical consultation advised. 2. Aortic Atherosclerosis (ICD10-I70.0). Electronically Signed   By: JIlona SorrelM.D.   On: 07/15/2021 17:26    Anti-infectives: Anti-infectives (From admission, onward)    Start     Dose/Rate Route Frequency Ordered Stop   07/16/21 0600  ceFAZolin (ANCEF)  IVPB 2g/100 mL premix        2 g 200 mL/hr over 30 Minutes Intravenous On call to O.R. 07/15/21 1717 07/15/21 1847   07/15/21 1831  ceFAZolin (ANCEF) 2-4 GM/100ML-% IVPB       Note to Pharmacy: Lear Ng   : cabinet override      07/15/21 1831 07/15/21 1847       Assessment/Plan: Zachary [name] is a 50 yo s/p ventral hernia repair with mesh for an incarcerated hernia. Doing fair. Needs to have BM.  PRN for pain IS, OOB Full liquids  Binder CBC in AM Lovenox now, SCDs LR stopped    LOS: 0 days    [name] 07/17/2021

## 2021-07-18 LAB — CBC WITH DIFFERENTIAL/PLATELET
Abs Immature Granulocytes: 0.09 10*3/uL — ABNORMAL HIGH (ref 0.00–0.07)
Basophils Absolute: 0.1 10*3/uL (ref 0.0–0.1)
Basophils Relative: 0 %
Eosinophils Absolute: 0.3 10*3/uL (ref 0.0–0.5)
Eosinophils Relative: 2 %
HCT: 39.8 % (ref 39.0–52.0)
Hemoglobin: 13.4 g/dL (ref 13.0–17.0)
Immature Granulocytes: 1 %
Lymphocytes Relative: 21 %
Lymphs Abs: 2.9 10*3/uL (ref 0.7–4.0)
MCH: 30.4 pg (ref 26.0–34.0)
MCHC: 33.7 g/dL (ref 30.0–36.0)
MCV: 90.2 fL (ref 80.0–100.0)
Monocytes Absolute: 0.8 10*3/uL (ref 0.1–1.0)
Monocytes Relative: 6 %
Neutro Abs: 9.6 10*3/uL — ABNORMAL HIGH (ref 1.7–7.7)
Neutrophils Relative %: 70 %
Platelets: 209 10*3/uL (ref 150–400)
RBC: 4.41 MIL/uL (ref 4.22–5.81)
RDW: 11.9 % (ref 11.5–15.5)
WBC: 13.7 10*3/uL — ABNORMAL HIGH (ref 4.0–10.5)
nRBC: 0 % (ref 0.0–0.2)

## 2021-07-18 MED ORDER — LACTATED RINGERS IV SOLN: INTRAVENOUS | Status: DC

## 2021-07-18 MED ORDER — ORAL CARE MOUTH RINSE: 15.0000 mL | Freq: Once | OROMUCOSAL | Status: AC

## 2021-07-18 MED ORDER — CHLORHEXIDINE GLUCONATE 0.12 % MT SOLN
15.0000 mL | Freq: Once | OROMUCOSAL | Status: AC
  Administered 2021-07-18: 15 mL via OROMUCOSAL

## 2021-07-18 NOTE — Progress Notes (Signed)
[facility]  Having flatus but no BM. Eating some soft diet for lunch.   BP 133/85 (BP Location: Right Arm)   Pulse 71   Temp 98.7 F (37.1 C) (Oral)   Resp 18   Ht '6\' 1"'$  (1.854 m)   Wt 115.7 kg   SpO2 96%   BMI 33.64 kg/m  Soft, nondistended, appropriately tender, staples c/d/I with no erythema and minor SS drainage on ABD  Soft diet today Hopefully BM today /overnight and dc home tomorrow   Curlene Labrum, MD [address], [place] [id number] [phone] (office)

## 2021-07-19 MED ORDER — MAGNESIUM HYDROXIDE 400 MG/5ML PO SUSP
15.0000 mL | Freq: Once | ORAL | Status: AC
  Administered 2021-07-19: 15 mL via ORAL
  Filled 2021-07-19: qty 30

## 2021-07-19 MED ORDER — ONDANSETRON 4 MG PO TBDP: 4.0000 mg | ORAL_TABLET | Freq: Four times a day (QID) | ORAL | 0 refills | Status: DC | PRN

## 2021-07-19 MED ORDER — DOCUSATE SODIUM 100 MG PO CAPS: 100.0000 mg | ORAL_CAPSULE | Freq: Two times a day (BID) | ORAL | 0 refills | Status: DC | PRN

## 2021-07-19 MED ORDER — OXYCODONE HCL 5 MG PO TABS: 5.0000 mg | ORAL_TABLET | ORAL | 0 refills | Status: DC | PRN

## 2021-07-19 NOTE — Discharge Instructions (Signed)
Discharge Open Abdominal Surgery Instructions:  Take your FMLA paperwork to the office. You will be out for about 8 weeks or have restrictions for about 8 weeks after surgery.   Common Complaints: Pain at the incision site is common. This will improve with time. Take your pain medications as described below. Some nausea is common and poor appetite. The main goal is to stay hydrated the first few days after surgery.   Diet/ Activity: Diet as tolerated. You have started and tolerated a diet in the hospital, and should continue to increase what you are able to eat.   You may not have a large appetite, but it is important to stay hydrated. Drink 64 ounces of water a day. Your appetite will return with time.  Keep a dry dressing in place over your staples daily or as needed. Some minor pink/ blood tinged drainage is expected. This will stop in a few days after surgery.  Shower per your regular routine daily.  Do not take hot showers. Take warm showers that are less than 10 minutes. Pat the incision dry. Wear an abdominal binder daily with activity. You do not have to wear this while sleeping or sitting.  Rest and listen to your body, but do not remain in bed all day.  Walk everyday for at least 15-20 minutes. Deep cough and move around every 1-2 hours in the first few days after surgery.  Do not lift > 10 lbs, perform excessive bending, pushing, pulling, squatting for 6-8 weeks after surgery.  The activity restrictions and the abdominal binder are to prevent hernia formation at your incision while you are healing.  Do not place lotions or balms on your incision unless instructed to specifically by Dr. Constance Haw.   Pain Expectations and Narcotics: -After surgery you will have pain associated with your incisions and this is normal. The pain is muscular and nerve pain, and will get better with time. -You are encouraged and expected to take non narcotic medications like tylenol and ibuprofen (when able)  to treat pain as multiple modalities can aid with pain treatment. -Narcotics are only used when pain is severe or there is breakthrough pain. -You are not expected to have a pain score of 0 after surgery, as we cannot prevent pain. A pain score of 3-4 that allows you to be functional, move, walk, and tolerate some activity is the goal. The pain will continue to improve over the days after surgery and is dependent on your surgery. -Due to [place] law, we are only able to give a certain amount of pain medication to treat post operative pain, and we only give additional narcotics on a patient by patient basis.  -For most laparoscopic surgery, studies have shown that the majority of patients only need 10-15 narcotic pills, and for open surgeries most patients only need 15-20.   -Having appropriate expectations of pain and knowledge of pain management with non narcotics is important as we do not want anyone to become addicted to narcotic pain medication.  -Using ice packs in the first 48 hours and heating pads after 48 hours, wearing an abdominal binder (when recommended), and using over the counter medications are all ways to help with pain management.   -Simple acts like meditation and mindfulness practices after surgery can also help with pain control and research has proven the benefit of these practices.  Medication: Take tylenol and ibuprofen as needed for pain control, alternating every 4-6 hours.  Example:  Tylenol '1000mg'$  @ 6am,  12noon, 6pm, 13mdnight (Do not exceed '4000mg'$  of tylenol a day). Ibuprofen '800mg'$  @ 9am, 3pm, 9pm, 3am (Do not exceed '3600mg'$  of ibuprofen a day).  Take Roxicodone for breakthrough pain every 4 hours.  Take Colace for constipation related to narcotic pain medication. If you do not have a bowel movement in 2 days, take Miralax over the counter.  Drink plenty of water to also prevent constipation.   Contact Information: If you have questions or concerns, please call our office,  [phone] Monday- Thursday 8AM-5PM and Friday 8AM-12Noon.  If it is after hours or on the weekend, please call [facility] Main [facility], [phone] and ask to speak to the surgeon on call for Dr. [facility]

## 2021-07-19 NOTE — Discharge Summary (Signed)
Physician Discharge Summary  Patient ID: Zachary [name] MRN: [id number] DOB/AGE: [birth year] 49 y.o.  Admit date: 07/15/2021 Discharge date: 07/19/2021  Admission Diagnoses: Incarcerated incisional hernia   Discharge Diagnoses:  Principal Problem:   Incarcerated incisional hernia Active Problems:   Incarcerated ventral hernia   Discharged Condition: good  Hospital Course: Zachary [name] is a 50 yo with an incarcerated incisional hernia that came into the hospital with small intestine incarcerated. He went back to the OR emergently and was found to have ischemic bowel but it was viable and no bowel resection had to be completed. His hernia was repaired with mesh. Post operatively he has done well and has been ambulating. He has been tolerating a diet and passing gas. Prior to his discharge he was given milk of magnesia to try to help aid in a BM.    Consults: None  Significant Diagnostic Studies: CT with incarcerated ventral hernia with small bowel  Treatments: IV hydration and 7/27 open hernia repair with mesh  Discharge Exam: Blood pressure (!) 149/82, pulse 75, temperature 98.4 F (36.9 C), resp. rate 18, height '6\' 1"'$  (1.854 m), weight 115.7 kg, SpO2 99 %. General appearance: alert, cooperative, and no distress Resp: normal work of breathing GI: soft, nondistended, appropriately tender, minor SS drainage from midline staples, no erythema abd replaced   Disposition: Discharge disposition: 01-Home or Self Care      Discharge Instructions     Call MD for:  difficulty breathing, headache or visual disturbances   Complete by: As directed    Call MD for:  extreme fatigue   Complete by: As directed    Call MD for:  persistant dizziness or light-headedness   Complete by: As directed    Call MD for:  persistant nausea and vomiting   Complete by: As directed    Call MD for:  redness, tenderness, or signs of infection (pain, swelling, redness, odor or green/yellow discharge around  incision site)   Complete by: As directed    Call MD for:  severe uncontrolled pain   Complete by: As directed    Call MD for:  temperature >100.4   Complete by: As directed    Discharge wound care:   Complete by: As directed    Dry pad to incision daily while it is draining   Increase activity slowly   Complete by: As directed       Allergies as of 07/19/2021   No Known Allergies      Medication List     TAKE these medications    diphenhydrAMINE 25 MG tablet Commonly known as: BENADRYL Take 25 mg by mouth at bedtime as needed for itching (heat rash).   docusate sodium 100 MG capsule Commonly known as: COLACE Take 1 capsule (100 mg total) by mouth 2 (two) times daily as needed for mild constipation.   ondansetron 4 MG disintegrating tablet Commonly known as: ZOFRAN-ODT Take 1 tablet (4 mg total) by mouth every 6 (six) hours as needed for nausea.   oxyCODONE 5 MG immediate release tablet Commonly known as: Oxy IR/ROXICODONE Take 1 tablet (5 mg total) by mouth every 4 (four) hours as needed for severe pain or breakthrough pain.   simvastatin 10 MG tablet Commonly known as: Zocor Take 1 tablet (10 mg total) by mouth every evening.   triamcinolone cream 0.1 % Commonly known as: KENALOG Apply 1 application topically 2 (two) times daily.       ASK your doctor about these medications  hydrOXYzine 25 MG tablet Commonly known as: ATARAX/VISTARIL Take 1-2 tablets (25-50 mg total) by mouth every 8 (eight) hours as needed.               Discharge Care Instructions  (From admission, onward)           Start     Ordered   07/19/21 0000  Discharge wound care:       Comments: Dry pad to incision daily while it is draining   07/19/21 1205            Follow-up Information     [name], MD Follow up on 08/18/2021.   Specialty: General Surgery Why: hernia check and plan for work return Contact information: [address] [place]  [facility] [address] [phone]         Aviva Signs, MD Follow up on 07/28/2021.   Specialty: General Surgery Why: staple removal Contact information: [id number] [id number] [phone]                 Signed: [name] 07/19/2021, 12:12 PM

## 2021-07-28 ENCOUNTER — Encounter: Payer: Self-pay | Admitting: General Surgery

## 2021-07-28 ENCOUNTER — Ambulatory Visit (INDEPENDENT_AMBULATORY_CARE_PROVIDER_SITE_OTHER): Payer: 59 | Admitting: General Surgery

## 2021-07-28 ENCOUNTER — Other Ambulatory Visit: Payer: Self-pay

## 2021-07-28 VITALS — BP 110/71 | HR 77 | Temp 98.2°F | Resp 16 | Ht 72.0 in | Wt 268.2 lb

## 2021-07-28 DIAGNOSIS — Z09 Encounter for follow-up examination after completed treatment for conditions other than malignant neoplasm: Secondary | ICD-10-CM

## 2021-07-28 MED ORDER — OXYCODONE HCL 5 MG PO TABS: 5.0000 mg | ORAL_TABLET | ORAL | 0 refills | Status: DC | PRN

## 2021-07-28 NOTE — Progress Notes (Signed)
Subjective:     Zachary [name]  Patient is here for postoperative visit, status post ventral herniorrhaphy with mesh.  Patient states he is wearing his binder frequently.  He is still having some incisional pain.  He denies any fever or chills.   Objective:    BP 110/71   Pulse 77   Temp 98.2 F (36.8 C) (Oral)   Resp 16   Ht 6' (1.829 m)   Wt 268 lb 3.2 oz (121.7 kg)   SpO2 95%   BMI 36.37 kg/m   General:  alert, cooperative, and no distress  Abdomen is soft, incision healing well.  Staples removed, Steri-Strips applied.  Possible seroma to the left of the incision, but is not tense.  No erythema is noted.     Assessment:    Doing well postoperatively.    Plan:   Patient may increase his activity as able.  He should not lift anything over 20 pounds for the next few weeks.  Continue wearing the binder as needed.  His oxycodone has been reordered.  Follow-up with Dr. Constance Haw on 08/18/2021.

## 2021-08-18 ENCOUNTER — Encounter: Payer: Self-pay | Admitting: General Surgery

## 2021-08-18 ENCOUNTER — Ambulatory Visit (INDEPENDENT_AMBULATORY_CARE_PROVIDER_SITE_OTHER): Payer: 59 | Admitting: General Surgery

## 2021-08-18 ENCOUNTER — Other Ambulatory Visit: Payer: Self-pay

## 2021-08-18 VITALS — BP 124/67 | HR 75 | Temp 98.6°F | Resp 14 | Ht 72.0 in | Wt 263.0 lb

## 2021-08-18 DIAGNOSIS — Z09 Encounter for follow-up examination after completed treatment for conditions other than malignant neoplasm: Secondary | ICD-10-CM

## 2021-08-18 NOTE — Progress Notes (Signed)
Subjective:     Zachary [name]  Patient here for follow up status post incisional herniorrhaphy with mesh.  Patient is doing well.  He has increased his activity as able.  He has minimal incisional pain. Objective:    BP 124/67   Pulse 75   Temp 98.6 F (37 C) (Other (Comment))   Resp 14   Ht 6' (1.829 m)   Wt 263 lb (119.3 kg)   SpO2 98%   BMI 35.67 kg/m   General:  alert, cooperative, and no distress  Abdomen is soft, incision well-healed.     Assessment:    Doing well postoperatively.    Plan:   May gradually increase his activity as able.  May try some abdominal muscle toning, though is limited due to his multiple back surgeries.  He may return to work without restrictions on 08/31/2021.  Follow-up here as needed.

## 2022-07-19 ENCOUNTER — Ambulatory Visit
Admission: EM | Admit: 2022-07-19 | Discharge: 2022-07-19 | Disposition: A | Discharge status: Home / Self Care | Payer: 59 | Attending: Nurse Practitioner | Admitting: Nurse Practitioner

## 2022-07-19 DIAGNOSIS — H1589 Other disorders of sclera: Secondary | ICD-10-CM | POA: Diagnosis not present

## 2022-07-19 DIAGNOSIS — H5789 Other specified disorders of eye and adnexa: Secondary | ICD-10-CM

## 2022-07-19 MED ORDER — TOBRAMYCIN-DEXAMETHASONE 0.3-0.1 % OP SUSP: 1.0000 [drp] | OPHTHALMIC | 0 refills | Status: AC

## 2022-07-19 NOTE — ED Provider Notes (Signed)
[facility]    CSN: [id number] Arrival date & time: 07/19/22  1646      History   Chief Complaint Chief Complaint  Patient presents with   Eye Problem    HPI Zachary [name] is a 51 y.o. male.   The history is provided by the patient.   Patient presents for complaints of redness, tearing, and feeling of a foreign object in the left eye for the past 2 days.  Patient does not recall any occurrence where this may have caused him to get something in his eye, but he states "it feels like it".  He states that the area of irritation in his eyes seems to move around.  He presents today for complaints of redness, tearing, and left eye pain.  Patient reports that he wears glasses, denies use of contacts.  He denies fever, chills, change in vision, loss of vision, blurred vision, headache, or eye drainage.  Past Medical History:  Diagnosis Date   Chicken pox    Frequent headaches    Psoriasis     Patient Active Problem List   Diagnosis Date Noted   Incarcerated ventral hernia 07/17/2021   Incarcerated incisional hernia 07/15/2021   TIA (transient ischemic attack) 05/28/2020   Migraine variant 05/28/2020   Routine general medical examination at a health care facility 11/19/2015   Psoriasis 10/28/2015   Generalized headaches 10/28/2015   Abdominal hernia 10/28/2015   Left leg weakness 11/29/2011   Stiffness of joints, not elsewhere classified, multiple sites 11/29/2011    Past Surgical History:  Procedure Laterality Date   APPENDECTOMY     BACK SURGERY     FOOT SURGERY     VENTRAL HERNIA REPAIR N/A 07/15/2021   Procedure: HERNIA REPAIR VENTRAL ADULT, incarcerated;  Surgeon: [name], MD;  Location: [facility];  Service: General;  Laterality: N/A;       Home Medications    Prior to Admission medications   Medication Sig Start Date End Date Taking? Authorizing Provider  tobramycin-dexamethasone [facility]) ophthalmic solution Place 1 drop into the left eye  every 4 (four) hours while awake. 07/19/22  Yes Elim Economou-Warren, Alda Lea, NP  diphenhydrAMINE (BENADRYL) 25 MG tablet Take 25 mg by mouth at bedtime as needed for itching (heat rash).    [provider]    Family History Family History  Problem Relation Age of Onset   Lupus Mother    Prostate cancer Father    Bladder Cancer Father    Diabetes Father    Colon cancer Maternal Grandmother    Diabetes Maternal Grandmother     Social History Social History   Tobacco Use   Smoking status: Former    Packs/day: 1.50    Years: 30.00    Total pack years: 45.00    Types: Cigarettes    Quit date: 06/03/2011    Years since quitting: 11.1   Smokeless tobacco: Never  Vaping Use   Vaping Use: Never used  Substance Use Topics   Alcohol use: No   Drug use: Yes    Types: Marijuana    Comment: last used today 05/28/20     Allergies   Patient has no known allergies.   Review of Systems Review of Systems Per HPI  Physical Exam Triage Vital Signs ED Triage Vitals  Enc Vitals Group     BP 07/19/22 1726 137/84     Pulse Rate 07/19/22 1726 72     Resp 07/19/22 1726 20  Temp 07/19/22 1726 98.1 F (36.7 C)     Temp src --      SpO2 07/19/22 1726 98 %     Weight --      Height --      Head Circumference --      Peak Flow --      Pain Score 07/19/22 1725 4     Pain Loc --      Pain Edu? --      Excl. in [place]? --    No data found.  Updated Vital Signs BP 137/84   Pulse 72   Temp 98.1 F (36.7 C)   Resp 20   SpO2 98%   Visual Acuity Right Eye Distance:   Left Eye Distance:   Bilateral Distance:    Right Eye Near:   Left Eye Near:    Bilateral Near:     Physical Exam Vitals and nursing note reviewed.  Constitutional:      General: He is not in acute distress.    Appearance: Normal appearance.  HENT:     Head: Normocephalic.  Eyes:     General: Lids are normal. Lids are everted, no foreign bodies appreciated.        Left eye: Discharge present.No  foreign body or hordeolum.     Extraocular Movements: Extraocular movements intact.     Left eye: Normal extraocular motion and no nystagmus.     Conjunctiva/sclera:     Left eye: Left conjunctiva is injected. No exudate or hemorrhage.    Pupils: Pupils are equal, round, and reactive to light.     Comments: Eye Exam: Eyelids everted and swept for foreign body. The eye was anesthetized with 2 drops of  Tetracaine and stained with fluorescein. Examination under woods lamp does not reveal a foreign body or area of increased stain uptake. The eye was then irrigated copiously with saline.   Cardiovascular:     Rate and Rhythm: Normal rate.     Pulses: Normal pulses.  Pulmonary:     Effort: Pulmonary effort is normal.  Neurological:     Mental Status: He is alert.      UC Treatments / Results  Labs (all labs ordered are listed, but only abnormal results are displayed) Labs Reviewed - No data to display  EKG   Radiology No results found.  Procedures Procedures (including critical care time)  Medications Ordered in UC Medications - No data to display  Initial Impression / Assessment and Plan / UC Course  I have reviewed the triage vital signs and the nursing notes.  Pertinent labs & imaging results that were available during my care of the patient were reviewed by me and considered in my medical decision making (see chart for details).  Patient presents for complaints of left eye redness that started approximately 2 days ago.  Patient thinks he may have gotten something into the eye.  On exam, fluorescein stain was performed which did not show any uptake.  Patient has moderate erythema and injection of the left conjunctiva.  Denies visual changes at this time.  Given his current symptoms, will start patient on TobraDex to help with any possible superficial infection and with inflammation.  Supportive care instructions were also provided to the patient with strict indications of when  to go to the emergency department.  Patient advised to follow-up as needed. Final Clinical Impressions(s) / UC Diagnoses   Final diagnoses:  Redness of left eye  Injection of surface of  left eye     Discharge Instructions      Use eyedrops as prescribed.   Cool compresses to the eyes to help with pain or swelling. May continue the over-the-counter eyedrops you are using to help keep the eyes moist and decreased redness. Strict handwashing when applying medication.  Avoid rubbing or manipulating the eyes while symptoms persist. Go to the emergency department or to see your eye doctor immediately if you develop change in vision, sudden loss of vision, or if there is any other      ED Prescriptions     Medication Sig Dispense Auth. Provider   tobramycin-dexamethasone [facility]) ophthalmic solution Place 1 drop into the left eye every 4 (four) hours while awake. 5 mL Joleena Weisenburger-Warren, Alda Lea, NP      PDMP not reviewed this encounter.   Tish Men, NP 07/19/22 2008

## 2022-07-19 NOTE — ED Triage Notes (Signed)
Pt presents with left eye redness and pain, believes to be foreign body , denies vision change

## 2022-07-19 NOTE — Discharge Instructions (Addendum)
Use eyedrops as prescribed.   Cool compresses to the eyes to help with pain or swelling. May continue the over-the-counter eyedrops you are using to help keep the eyes moist and decreased redness. Strict handwashing when applying medication.  Avoid rubbing or manipulating the eyes while symptoms persist. Go to the emergency department or to see your eye doctor immediately if you develop change in vision, sudden loss of vision, or if there is any other

## 2024-03-08 ENCOUNTER — Ambulatory Visit: Payer: 59 | Admitting: Physician Assistant
